# Patient Record
Sex: Female | Born: 1990 | Race: White | Hispanic: No | Marital: Single | Smoking: Never smoker
Health system: Southern US, Community
[De-identification: ages and names within clinical notes are randomized; demographics above are authoritative.]

## PROBLEM LIST (undated history)

## (undated) DIAGNOSIS — N72 Inflammatory disease of cervix uteri: Secondary | ICD-10-CM

## (undated) DIAGNOSIS — F329 Major depressive disorder, single episode, unspecified: Secondary | ICD-10-CM

## (undated) DIAGNOSIS — E0500 Thyrotoxicosis with diffuse goiter without thyrotoxic crisis or storm: Secondary | ICD-10-CM

## (undated) DIAGNOSIS — E039 Hypothyroidism, unspecified: Secondary | ICD-10-CM

## (undated) DIAGNOSIS — I10 Essential (primary) hypertension: Secondary | ICD-10-CM

## (undated) DIAGNOSIS — A6920 Lyme disease, unspecified: Secondary | ICD-10-CM

## (undated) DIAGNOSIS — F419 Anxiety disorder, unspecified: Secondary | ICD-10-CM

## (undated) DIAGNOSIS — F32A Depression, unspecified: Secondary | ICD-10-CM

## (undated) HISTORY — DX: Inflammatory disease of cervix uteri: N72

## (undated) HISTORY — PX: CYST EXCISION: SHX5701

## (undated) HISTORY — DX: Thyrotoxicosis with diffuse goiter without thyrotoxic crisis or storm: E05.00

## (undated) HISTORY — DX: Lyme disease, unspecified: A69.20

## (undated) HISTORY — PX: WISDOM TOOTH EXTRACTION: SHX21

## (undated) HISTORY — DX: Major depressive disorder, single episode, unspecified: F32.9

## (undated) HISTORY — DX: Hypothyroidism, unspecified: E03.9

## (undated) HISTORY — DX: Anxiety disorder, unspecified: F41.9

## (undated) HISTORY — DX: Depression, unspecified: F32.A

## (undated) HISTORY — DX: Essential (primary) hypertension: I10

---
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
## 2010-05-08 LAB — HCG, SERUM, QUALITATIVE: Preg, Serum: NEGATIVE

## 2012-10-22 ENCOUNTER — Other Ambulatory Visit: Payer: Self-pay | Admitting: Certified Nurse Midwife

## 2012-10-22 NOTE — Telephone Encounter (Signed)
eScribe request for refill on G. SeasonaleLast filled - *07/2012 Last AEX - 07-10-11 Next AEX - not scheduled

## 2012-10-23 ENCOUNTER — Telehealth: Payer: Self-pay | Admitting: Certified Nurse Midwife

## 2012-10-23 MED ORDER — LEVONORGEST-ETH ESTRAD 91-DAY 0.15-0.03 MG PO TABS: 1.0000 | ORAL_TABLET | Freq: Every day | ORAL | Status: DC

## 2012-10-23 NOTE — Telephone Encounter (Signed)
Pt had follow up for OCP in 01/2012.  Pt due for AEX in June 2014.  Per Kennon Rounds, OK to do one more refill.  Pt needs AEX prior to that refill running out. i spoke with patient.  appt for AEX on 11/18/12.  Advised she has to keep this appt for more refills.  She voices understanding.

## 2012-10-23 NOTE — Telephone Encounter (Signed)
Patient calling to check on refill for seasonal, said pharmacy was supposed to call, didn't know if she needed an appt.

## 2012-11-13 ENCOUNTER — Encounter: Payer: Self-pay | Admitting: Certified Nurse Midwife

## 2012-11-18 ENCOUNTER — Ambulatory Visit (INDEPENDENT_AMBULATORY_CARE_PROVIDER_SITE_OTHER): Payer: Managed Care, Other (non HMO) | Admitting: Certified Nurse Midwife

## 2012-11-18 ENCOUNTER — Encounter: Payer: Self-pay | Admitting: Certified Nurse Midwife

## 2012-11-18 VITALS — BP 102/70 | HR 68 | Resp 16 | Ht 63.25 in | Wt 133.0 lb

## 2012-11-18 DIAGNOSIS — Z01419 Encounter for gynecological examination (general) (routine) without abnormal findings: Secondary | ICD-10-CM

## 2012-11-18 DIAGNOSIS — Z0000 Encounter for general adult medical examination without abnormal findings: Secondary | ICD-10-CM

## 2012-11-18 NOTE — Progress Notes (Signed)
22 y.o. G0P0000 Single Caucasian Fe here for annual exam.  Periods normal every 3 months with Seasonale. Working great! Graduated from college and working at first job. No partner change, no STD screen needed. No flare of Graves disease, due for follow up. No health issues today.  Patient's last menstrual period was 11/08/2012.          Sexually active: yes  The current method of family planning is OCP (estrogen/progesterone) and condoms all the time.    Exercising: yes  walk,run & yoga Smoker:  no  Health Maintenance: Pap:  none MMG:  none Colonoscopy:  none BMD:   none TDaP:  2010 Labs: none Self breast exam: done occ   reports that she has never smoked. She does not have any smokeless tobacco history on file. She reports that she drinks about 1.0 ounces of alcohol per week. She reports that she does not use illicit drugs.  Past Medical History  Diagnosis Date  . Graves disease     goiter  . Depression   . Lyme disease 2008    Past Surgical History  Procedure Laterality Date  . Wisdom tooth extraction      Current Outpatient Prescriptions  Medication Sig Dispense Refill  . IBUPROFEN PO Take by mouth as needed.      Marland Kitchen levonorgestrel-ethinyl estradiol (SEASONALE,INTROVALE,JOLESSA) 0.15-0.03 MG tablet Take 1 tablet by mouth daily.  1 Package  4   No current facility-administered medications for this visit.    Family History  Problem Relation Age of Onset  . Thyroid disease Father   . ALS Father   . Depression Sister     ROS:  Pertinent items are noted in HPI.  Otherwise, a comprehensive ROS was negative.  Exam:   BP 102/70  Pulse 68  Resp 16  Ht 5' 3.25" (1.607 m)  Wt 133 lb (60.328 kg)  BMI 23.36 kg/m2  LMP 11/08/2012 Height: 5' 3.25" (160.7 cm)  Ht Readings from Last 3 Encounters:  11/18/12 5' 3.25" (1.607 m)    General appearance: alert, cooperative and appears stated age Head: Normocephalic, without obvious abnormality, atraumatic Neck: no adenopathy,  supple, symmetrical, trachea midline and thyroid normal to inspection and palpation Lungs: clear to auscultation bilaterally Breasts: normal appearance, no masses or tenderness, No nipple retraction or dimpling, No nipple discharge or bleeding, No axillary or supraclavicular adenopathy Heart: regular rate and rhythm Abdomen: soft, non-tender; no masses,  no organomegaly Extremities: extremities normal, atraumatic, no cyanosis or edema Skin: Skin color, texture, turgor normal. No rashes or lesions Lymph nodes: Cervical, supraclavicular, and axillary nodes normal. No abnormal inguinal nodes palpated Neurologic: Grossly normal   Pelvic: External genitalia:  no lesions              Urethra:  normal appearing urethra with no masses, tenderness or lesions              Bartholin's and Skene's: normal                 Vagina: normal appearing vagina with normal color and discharge, no lesions              Cervix: normal, non tender              Pap taken: yes Bimanual Exam:  Uterus:  normal size, contour, position, consistency, mobility, non-tender and anteverted              Adnexa: normal adnexa and no mass, fullness, tenderness  Rectovaginal: Confirms               Anus: deferred  A:  Well Woman with normal exam  Contraception OCP  Graves disease resolved  P:   Reviewed health and wellness pertinent to exam  Rx Seasonale see order  Continue follow up as indicated  Pap smear as per guidelines   pap smear taken today  counseled on breast self exam, STD prevention, HIV risk factors and prevention, use and side effects of OCP's, adequate intake of calcium and vitamin D, diet and exercise  return annually or prn  An After Visit Summary was printed and given to the patient.

## 2012-11-18 NOTE — Patient Instructions (Signed)
General topics  Next pap or exam is  due in 1 year Take a Women's multivitamin Take 1200 mg. of calcium daily - prefer dietary If any concerns in interim to call back  Breast Self-Awareness Practicing breast self-awareness may pick up problems early, prevent significant medical complications, and possibly save your life. By practicing breast self-awareness, you can become familiar with how your breasts look and feel and if your breasts are changing. This allows you to notice changes early. It can also offer you some reassurance that your breast health is good. One way to learn what is normal for your breasts and whether your breasts are changing is to do a breast self-exam. If you find a lump or something that was not present in the past, it is best to contact your caregiver right away. Other findings that should be evaluated by your caregiver include nipple discharge, especially if it is bloody; skin changes or reddening; areas where the skin seems to be pulled in (retracted); or new lumps and bumps. Breast pain is seldom associated with cancer (malignancy), but should also be evaluated by a caregiver. BREAST SELF-EXAM The best time to examine your breasts is 5 7 days after your menstrual period is over.  ExitCare Patient Information 2013 [employer].   Exercise to Stay Healthy Exercise helps you become and stay healthy. EXERCISE IDEAS AND TIPS Choose exercises that:  You enjoy.  Fit into your day. You do not need to exercise really hard to be healthy. You can do exercises at a slow or medium level and stay healthy. You can:  Stretch before and after working out.  Try yoga, Pilates, or tai chi.  Lift weights.  Walk fast, swim, jog, run, climb stairs, bicycle, dance, or rollerskate.  Take aerobic classes. Exercises that burn about 150 calories:  Running 1  miles in 15 minutes.  Playing volleyball for 45 to 60 minutes.  Washing and waxing a car for 45 to 60  minutes.  Playing touch football for 45 minutes.  Walking 1  miles in 35 minutes.  Pushing a stroller 1  miles in 30 minutes.  Playing basketball for 30 minutes.  Raking leaves for 30 minutes.  Bicycling 5 miles in 30 minutes.  Walking 2 miles in 30 minutes.  Dancing for 30 minutes.  Shoveling snow for 15 minutes.  Swimming laps for 20 minutes.  Walking up stairs for 15 minutes.  Bicycling 4 miles in 15 minutes.  Gardening for 30 to 45 minutes.  Jumping rope for 15 minutes.  Washing windows or floors for 45 to 60 minutes. Document Released: 03/10/2010 Document Revised: 04/30/2011 Document Reviewed: 03/10/2010 ExitCare Patient Information 2013 [employer].   Other topics ( that may be useful information):    Sexually Transmitted Disease Sexually transmitted disease (STD) refers to any infection that is passed from person to person during sexual activity. This may happen by way of saliva, semen, blood, vaginal mucus, or urine. Common STDs include:  Gonorrhea.  Chlamydia.  Syphilis.  HIV/AIDS.  Genital herpes.  Hepatitis B and C.  Trichomonas.  Human papillomavirus (HPV).  Pubic lice. CAUSES  An STD may be spread by bacteria, virus, or parasite. A person can get an STD by:  Sexual intercourse with an infected person.  Sharing sex toys with an infected person.  Sharing needles with an infected person.  Having intimate contact with the genitals, mouth, or rectal areas of an infected person. SYMPTOMS  Some people may not have any symptoms, but   they can still pass the infection to others. Different STDs have different symptoms. Symptoms include:  Painful or bloody urination.  Pain in the pelvis, abdomen, vagina, anus, throat, or eyes.  Skin rash, itching, irritation, growths, or sores (lesions). These usually occur in the genital or anal area.  Abnormal vaginal discharge.  Penile discharge in men.  Soft, flesh-colored skin growths in the  genital or anal area.  Fever.  Pain or bleeding during sexual intercourse.  Swollen glands in the groin area.  Yellow skin and eyes (jaundice). This is seen with hepatitis. DIAGNOSIS  To make a diagnosis, your caregiver may:  Take a medical history.  Perform a physical exam.  Take a specimen (culture) to be examined.  Examine a sample of discharge under a microscope.  Perform blood test TREATMENT   Chlamydia, gonorrhea, trichomonas, and syphilis can be cured with antibiotic medicine.  Genital herpes, hepatitis, and HIV can be treated, but not cured, with prescribed medicines. The medicines will lessen the symptoms.  Genital warts from HPV can be treated with medicine or by freezing, burning (electrocautery), or surgery. Warts may come back.  HPV is a virus and cannot be cured with medicine or surgery.However, abnormal areas may be followed very closely by your caregiver and may be removed from the cervix, vagina, or vulva through office procedures or surgery. If your diagnosis is confirmed, your recent sexual partners need treatment. This is true even if they are symptom-free or have a negative culture or evaluation. They should not have sex until their caregiver says it is okay. HOME CARE INSTRUCTIONS  All sexual partners should be informed, tested, and treated for all STDs.  Take your antibiotics as directed. Finish them even if you start to feel better.  Only take over-the-counter or prescription medicines for pain, discomfort, or fever as directed by your caregiver.  Rest.  Eat a balanced diet and drink enough fluids to keep your urine clear or pale yellow.  Do not have sex until treatment is completed and you have followed up with your caregiver. STDs should be checked after treatment.  Keep all follow-up appointments, Pap tests, and blood tests as directed by your caregiver.  Only use latex condoms and water-soluble lubricants during sexual activity. Do not use  petroleum jelly or oils.  Avoid alcohol and illegal drugs.  Get vaccinated for HPV and hepatitis. If you have not received these vaccines in the past, talk to your caregiver about whether one or both might be right for you.  Avoid risky sex practices that can break the skin. The only way to avoid getting an STD is to avoid all sexual activity.Latex condoms and dental dams (for oral sex) will help lessen the risk of getting an STD, but will not completely eliminate the risk. SEEK MEDICAL CARE IF:   You have a fever.  You have any new or worsening symptoms. Document Released: 04/28/2002 Document Revised: 04/30/2011 Document Reviewed: 05/05/2010 ExitCare Patient Information 2013 [employer].    Domestic Abuse You are being battered or abused if someone close to you hits, pushes, or physically hurts you in any way. You also are being abused if you are forced into activities. You are being sexually abused if you are forced to have sexual contact of any kind. You are being emotionally abused if you are made to feel worthless or if you are constantly threatened. It is important to remember that help is available. No one has the right to abuse you. PREVENTION OF FURTHER   ABUSE  Learn the warning signs of danger. This varies with situations but may include: the use of alcohol, threats, isolation from friends and family, or forced sexual contact. Leave if you feel that violence is going to occur.  If you are attacked or beaten, report it to the police so the abuse is documented. You do not have to press charges. The police can protect you while you or the attackers are leaving. Get the officer's name and badge number and a copy of the report.  Find someone you can trust and tell them what is happening to you: your caregiver, a nurse, clergy member, close friend or family member. Feeling ashamed is natural, but remember that you have done nothing wrong. No one deserves abuse. Document Released:  02/03/2000 Document Revised: 04/30/2011 Document Reviewed: 04/13/2010 ExitCare Patient Information 2013 [employer].    How Much is Too Much Alcohol? Drinking too much alcohol can cause injury, accidents, and health problems. These types of problems can include:   Car crashes.  Falls.  Family fighting (domestic violence).  Drowning.  Fights.  Injuries.  Burns.  Damage to certain organs.  Having a baby with birth defects. ONE DRINK CAN BE TOO MUCH WHEN YOU ARE:  Working.  Pregnant or breastfeeding.  Taking medicines. Ask your doctor.  Driving or planning to drive. If you or someone you know has a drinking problem, get help from a doctor.  Document Released: 12/02/2008 Document Revised: 04/30/2011 Document Reviewed: 12/02/2008 ExitCare Patient Information 2013 [employer].   Smoking Hazards Smoking cigarettes is extremely bad for your health. Tobacco smoke has over 200 known poisons in it. There are over 60 chemicals in tobacco smoke that cause cancer. Some of the chemicals found in cigarette smoke include:   Cyanide.  Benzene.  Formaldehyde.  Methanol (wood alcohol).  Acetylene (fuel used in welding torches).  Ammonia. Cigarette smoke also contains the poisonous gases nitrogen oxide and carbon monoxide.  Cigarette smokers have an increased risk of many serious medical problems and Smoking causes approximately:  90% of all lung cancer deaths in men.  80% of all lung cancer deaths in women.  90% of deaths from chronic obstructive lung disease. Compared with nonsmokers, smoking increases the risk of:  Coronary heart disease by 2 to 4 times.  Stroke by 2 to 4 times.  Men developing lung cancer by 23 times.  Women developing lung cancer by 13 times.  Dying from chronic obstructive lung diseases by 12 times.  . Smoking is the most preventable cause of death and disease in our society.  WHY IS SMOKING ADDICTIVE?  Nicotine is the chemical  agent in tobacco that is capable of causing addiction or dependence.  When you smoke and inhale, nicotine is absorbed rapidly into the bloodstream through your lungs. Nicotine absorbed through the lungs is capable of creating a powerful addiction. Both inhaled and non-inhaled nicotine may be addictive.  Addiction studies of cigarettes and spit tobacco show that addiction to nicotine occurs mainly during the teen years, when young people begin using tobacco products. WHAT ARE THE BENEFITS OF QUITTING?  There are many health benefits to quitting smoking.   Likelihood of developing cancer and heart disease decreases. Health improvements are seen almost immediately.  Blood pressure, pulse rate, and breathing patterns start returning to normal soon after quitting. QUITTING SMOKING   American Lung Association - 1-800-LUNGUSA  American Cancer Society - 1-800-ACS-2345 Document Released: 03/15/2004 Document Revised: 04/30/2011 Document Reviewed: 11/17/2008 ExitCare Patient Information 2013 [employer].   Stress Management Stress is a state of physical or mental tension that often results from changes in your life or normal routine. Some common causes of stress are:  Death of a loved one.  Injuries or severe illnesses.  Getting fired or changing jobs.  Moving into a new home. Other causes may be:  Sexual problems.  Business or financial losses.  Taking on a large debt.  Regular conflict with someone at home or at work.  Constant tiredness from lack of sleep. It is not just bad things that are stressful. It may be stressful to:  Win the lottery.  Get married.  Buy a new car. The amount of stress that can be easily tolerated varies from person to person. Changes generally cause stress, regardless of the types of change. Too much stress can affect your health. It may lead to physical or emotional problems. Too little stress (boredom) may also become stressful. SUGGESTIONS TO  REDUCE STRESS:  Talk things over with your family and friends. It often is helpful to share your concerns and worries. If you feel your problem is serious, you may want to get help from a professional counselor.  Consider your problems one at a time instead of lumping them all together. Trying to take care of everything at once may seem impossible. List all the things you need to do and then start with the most important one. Set a goal to accomplish 2 or 3 things each day. If you expect to do too many in a single day you will naturally fail, causing you to feel even more stressed.  Do not use alcohol or drugs to relieve stress. Although you may feel better for a short time, they do not remove the problems that caused the stress. They can also be habit forming.  Exercise regularly - at least 3 times per week. Physical exercise can help to relieve that "uptight" feeling and will relax you.  The shortest distance between despair and hope is often a good night's sleep.  Go to bed and get up on time allowing yourself time for appointments without being rushed.  Take a short "time-out" period from any stressful situation that occurs during the day. Close your eyes and take some deep breaths. Starting with the muscles in your face, tense them, hold it for a few seconds, then relax. Repeat this with the muscles in your neck, shoulders, hand, stomach, back and legs.  Take good care of yourself. Eat a balanced diet and get plenty of rest.  Schedule time for having fun. Take a break from your daily routine to relax. HOME CARE INSTRUCTIONS   Call if you feel overwhelmed by your problems and feel you can no longer manage them on your own.  Return immediately if you feel like hurting yourself or someone else. Document Released: 08/01/2000 Document Revised: 04/30/2011 Document Reviewed: 03/24/2007 ExitCare Patient Information 2013 [employer].   

## 2012-11-20 LAB — IPS PAP TEST WITH REFLEX TO HPV

## 2012-11-20 NOTE — Progress Notes (Signed)
Note reviewed, agree with plan.  Camerin Jimenez, MD  

## 2013-02-19 DIAGNOSIS — E039 Hypothyroidism, unspecified: Secondary | ICD-10-CM

## 2013-02-19 HISTORY — DX: Hypothyroidism, unspecified: E03.9

## 2013-11-20 ENCOUNTER — Encounter: Payer: Self-pay | Admitting: Gynecology

## 2013-11-20 ENCOUNTER — Ambulatory Visit (INDEPENDENT_AMBULATORY_CARE_PROVIDER_SITE_OTHER): Payer: Managed Care, Other (non HMO) | Admitting: Gynecology

## 2013-11-20 VITALS — BP 130/80 | HR 72 | Resp 14 | Ht 64.0 in | Wt 128.0 lb

## 2013-11-20 DIAGNOSIS — Z3041 Encounter for surveillance of contraceptive pills: Secondary | ICD-10-CM

## 2013-11-20 DIAGNOSIS — Z124 Encounter for screening for malignant neoplasm of cervix: Secondary | ICD-10-CM

## 2013-11-20 DIAGNOSIS — Z01419 Encounter for gynecological examination (general) (routine) without abnormal findings: Secondary | ICD-10-CM

## 2013-11-20 MED ORDER — LEVONORGEST-ETH ESTRAD 91-DAY 0.15-0.03 MG PO TABS: 1.0000 | ORAL_TABLET | Freq: Every day | ORAL | Status: DC

## 2013-11-20 NOTE — Progress Notes (Signed)
23 y.o. Single Caucasian female   G0P0000 here for annual exam. Pt is  currently sexually active.  She reports  using condoms on a regular basis.  First sexual activity at 23 years old, 7  number of lifetime partners.  New partner-60m, no dyspareunia, condoms every time.  t declines std screening. Pt diagnosed with hypothyroid last week, pt was noticed to have elevated BP and "anxiety" last year, started on both zoloft and inderal but is being evaluated for Pheochromocytoma. Reports sx are physical and not emotional is trying to wean off zoloft.   Patient's last menstrual period was 10/16/2013.          Sexually active: Yes.    The current method of family planning is OCP (estrogen/progesterone).    Exercising: Yes.    walking, yoga qd Last pap: 11/18/12 Neg Alcohol: 2 drinks/wk Tobacco:  no Drugs: no Gardisil: yes, completed: 8 years ago   Labs: Endocrinologist  Health Maintenance  Topic Date Due  . Tetanus/tdap  08/06/2009  . Influenza Vaccine  09/19/2013  . Pap Smear  11/19/2015    Family History  Problem Relation Age of Onset  . Thyroid disease Father   . ALS Father   . Depression Sister     There are no active problems to display for this patient.   Past Medical History  Diagnosis Date  . Graves disease     goiter  . Depression   . Lyme disease 2008  . Hypothyroidism     Past Surgical History  Procedure Laterality Date  . Wisdom tooth extraction      Allergies: Review of patient's allergies indicates no known allergies.  Current Outpatient Prescriptions  Medication Sig Dispense Refill  . IBUPROFEN PO Take by mouth as needed.      Marland Kitchen levonorgestrel-ethinyl estradiol (SEASONALE,INTROVALE,JOLESSA) 0.15-0.03 MG tablet Take 1 tablet by mouth daily.  1 Package  4  . levothyroxine (SYNTHROID, LEVOTHROID) 50 MCG tablet Take 50 mcg by mouth daily before breakfast.       No current facility-administered medications for this visit.    ROS: Pertinent items are noted in  HPI.  Exam:    Ht 5\' 4"  (1.626 m)  Wt 128 lb (58.06 kg)  BMI 21.96 kg/m2  LMP 10/16/2013 Weight change: @WEIGHTCHANGE @ Last 3 height recordings:  Ht Readings from Last 3 Encounters:  11/20/13 5\' 4"  (1.626 m)  11/18/12 5' 3.25" (1.607 m)   General appearance: alert, cooperative and appears stated age Head: Normocephalic, without obvious abnormality, atraumatic Neck: no adenopathy, no carotid bruit, no JVD, supple, symmetrical, trachea midline and thyroid not enlarged, symmetric, no tenderness/mass/nodules Lungs: clear to auscultation bilaterally Breasts: Inspection negative, No nipple retraction or dimpling, No nipple discharge or bleeding, No axillary or supraclavicular adenopathy, Normal to palpation without dominant masses Heart: regular rate and rhythm, S1, S2 normal, no murmur, click, rub or gallop Abdomen: soft, non-tender; bowel sounds normal; no masses,  no organomegaly Extremities: extremities normal, atraumatic, no cyanosis or edema Skin: Skin color, texture, turgor normal. No rashes or lesions Lymph nodes: Cervical, supraclavicular, and axillary nodes normal. no inguinal nodes palpated Neurologic: Grossly normal   Pelvic: External genitalia:  normal escutcheon              Urethra: normal appearing urethra with no masses, tenderness or lesions              Bartholins and Skenes: Bartholin's, Urethra, Skene's normal  Vagina: normal appearing vagina with normal color and discharge, no lesions              Cervix: normal appearance              Pap taken: Yes.          Bimanual Exam:  Uterus:  uterus is normal size, shape, consistency and nontender                                      Adnexa:    normal adnexa in size, nontender and no masses                                      Rectovaginal: Confirms                                      Anus:  normal sphincter tone, no lesions       1. Encounter for surveillance of contraceptive pills  -  levonorgestrel-ethinyl estradiol (SEASONALE,INTROVALE,JOLESSA) 0.15-0.03 MG tablet; Take 1 tablet by mouth daily.  Dispense: 1 Package; Refill: 4  2. Encounter for routine gynecological examination  counseled on breast self exam, use and side effects of OCP's, adequate intake of calcium and vitamin D, diet and exercise return annually or prn Discussed STD prevention, regular condom use.   3. Screening for cervical cancer PAP guidelines reviewed - PAP with Reflex to HPV (IPS)  An After Visit Summary was printed and given to the patient.

## 2013-11-23 LAB — IPS PAP TEST WITH REFLEX TO HPV

## 2014-01-13 ENCOUNTER — Other Ambulatory Visit: Payer: Self-pay | Admitting: Certified Nurse Midwife

## 2014-11-24 ENCOUNTER — Encounter: Payer: Self-pay | Admitting: Certified Nurse Midwife

## 2014-11-24 ENCOUNTER — Ambulatory Visit (INDEPENDENT_AMBULATORY_CARE_PROVIDER_SITE_OTHER): Payer: Managed Care, Other (non HMO) | Admitting: Certified Nurse Midwife

## 2014-11-24 VITALS — BP 112/72 | HR 74 | Resp 20 | Ht 63.75 in | Wt 135.0 lb

## 2014-11-24 DIAGNOSIS — Z01419 Encounter for gynecological examination (general) (routine) without abnormal findings: Secondary | ICD-10-CM | POA: Diagnosis not present

## 2014-11-24 DIAGNOSIS — Z0000 Encounter for general adult medical examination without abnormal findings: Secondary | ICD-10-CM

## 2014-11-24 DIAGNOSIS — Z3041 Encounter for surveillance of contraceptive pills: Secondary | ICD-10-CM

## 2014-11-24 LAB — HEMOGLOBIN, FINGERSTICK: Hemoglobin, fingerstick: 13.3 g/dL (ref 12.0–16.0)

## 2014-11-24 MED ORDER — LEVONORGEST-ETH ESTRAD 91-DAY 0.15-0.03 MG PO TABS: 1.0000 | ORAL_TABLET | Freq: Every day | ORAL | Status: DC

## 2014-11-24 NOTE — Patient Instructions (Signed)
General topics  Next pap or exam is  due in 1 year Take a Women's multivitamin Take 1200 mg. of calcium daily - prefer dietary If any concerns in interim to call back  Breast Self-Awareness Practicing breast self-awareness may pick up problems early, prevent significant medical complications, and possibly save your life. By practicing breast self-awareness, you can become familiar with how your breasts look and feel and if your breasts are changing. This allows you to notice changes early. It can also offer you some reassurance that your breast health is good. One way to learn what is normal for your breasts and whether your breasts are changing is to do a breast self-exam. If you find a lump or something that was not present in the past, it is best to contact your caregiver right away. Other findings that should be evaluated by your caregiver include nipple discharge, especially if it is bloody; skin changes or reddening; areas where the skin seems to be pulled in (retracted); or new lumps and bumps. Breast pain is seldom associated with cancer (malignancy), but should also be evaluated by a caregiver. BREAST SELF-EXAM The best time to examine your breasts is 5 7 days after your menstrual period is over.  ExitCare Patient Information 2013 [employer].   Exercise to Stay Healthy Exercise helps you become and stay healthy. EXERCISE IDEAS AND TIPS Choose exercises that:  You enjoy.  Fit into your day. You do not need to exercise really hard to be healthy. You can do exercises at a slow or medium level and stay healthy. You can:  Stretch before and after working out.  Try yoga, Pilates, or tai chi.  Lift weights.  Walk fast, swim, jog, run, climb stairs, bicycle, dance, or rollerskate.  Take aerobic classes. Exercises that burn about 150 calories:  Running 1  miles in 15 minutes.  Playing volleyball for 45 to 60 minutes.  Washing and waxing a car for 45 to 60  minutes.  Playing touch football for 45 minutes.  Walking 1  miles in 35 minutes.  Pushing a stroller 1  miles in 30 minutes.  Playing basketball for 30 minutes.  Raking leaves for 30 minutes.  Bicycling 5 miles in 30 minutes.  Walking 2 miles in 30 minutes.  Dancing for 30 minutes.  Shoveling snow for 15 minutes.  Swimming laps for 20 minutes.  Walking up stairs for 15 minutes.  Bicycling 4 miles in 15 minutes.  Gardening for 30 to 45 minutes.  Jumping rope for 15 minutes.  Washing windows or floors for 45 to 60 minutes. Document Released: 03/10/2010 Document Revised: 04/30/2011 Document Reviewed: 03/10/2010 ExitCare Patient Information 2013 [employer].   Other topics ( that may be useful information):    Sexually Transmitted Disease Sexually transmitted disease (STD) refers to any infection that is passed from person to person during sexual activity. This may happen by way of saliva, semen, blood, vaginal mucus, or urine. Common STDs include:  Gonorrhea.  Chlamydia.  Syphilis.  HIV/AIDS.  Genital herpes.  Hepatitis B and C.  Trichomonas.  Human papillomavirus (HPV).  Pubic lice. CAUSES  An STD may be spread by bacteria, virus, or parasite. A person can get an STD by:  Sexual intercourse with an infected person.  Sharing sex toys with an infected person.  Sharing needles with an infected person.  Having intimate contact with the genitals, mouth, or rectal areas of an infected person. SYMPTOMS  Some people may not have any symptoms, but   they can still pass the infection to others. Different STDs have different symptoms. Symptoms include:  Painful or bloody urination.  Pain in the pelvis, abdomen, vagina, anus, throat, or eyes.  Skin rash, itching, irritation, growths, or sores (lesions). These usually occur in the genital or anal area.  Abnormal vaginal discharge.  Penile discharge in men.  Soft, flesh-colored skin growths in the  genital or anal area.  Fever.  Pain or bleeding during sexual intercourse.  Swollen glands in the groin area.  Yellow skin and eyes (jaundice). This is seen with hepatitis. DIAGNOSIS  To make a diagnosis, your caregiver may:  Take a medical history.  Perform a physical exam.  Take a specimen (culture) to be examined.  Examine a sample of discharge under a microscope.  Perform blood test TREATMENT   Chlamydia, gonorrhea, trichomonas, and syphilis can be cured with antibiotic medicine.  Genital herpes, hepatitis, and HIV can be treated, but not cured, with prescribed medicines. The medicines will lessen the symptoms.  Genital warts from HPV can be treated with medicine or by freezing, burning (electrocautery), or surgery. Warts may come back.  HPV is a virus and cannot be cured with medicine or surgery.However, abnormal areas may be followed very closely by your caregiver and may be removed from the cervix, vagina, or vulva through office procedures or surgery. If your diagnosis is confirmed, your recent sexual partners need treatment. This is true even if they are symptom-free or have a negative culture or evaluation. They should not have sex until their caregiver says it is okay. HOME CARE INSTRUCTIONS  All sexual partners should be informed, tested, and treated for all STDs.  Take your antibiotics as directed. Finish them even if you start to feel better.  Only take over-the-counter or prescription medicines for pain, discomfort, or fever as directed by your caregiver.  Rest.  Eat a balanced diet and drink enough fluids to keep your urine clear or pale yellow.  Do not have sex until treatment is completed and you have followed up with your caregiver. STDs should be checked after treatment.  Keep all follow-up appointments, Pap tests, and blood tests as directed by your caregiver.  Only use latex condoms and water-soluble lubricants during sexual activity. Do not use  petroleum jelly or oils.  Avoid alcohol and illegal drugs.  Get vaccinated for HPV and hepatitis. If you have not received these vaccines in the past, talk to your caregiver about whether one or both might be right for you.  Avoid risky sex practices that can break the skin. The only way to avoid getting an STD is to avoid all sexual activity.Latex condoms and dental dams (for oral sex) will help lessen the risk of getting an STD, but will not completely eliminate the risk. SEEK MEDICAL CARE IF:   You have a fever.  You have any new or worsening symptoms. Document Released: 04/28/2002 Document Revised: 04/30/2011 Document Reviewed: 05/05/2010 [facility] Patient Information 2013 [place].    Domestic Abuse You are being battered or abused if someone close to you hits, pushes, or physically hurts you in any way. You also are being abused if you are forced into activities. You are being sexually abused if you are forced to have sexual contact of any kind. You are being emotionally abused if you are made to feel worthless or if you are constantly threatened. It is important to remember that help is available. No one has the right to abuse you. PREVENTION OF FURTHER  ABUSE  Learn the warning signs of danger. This varies with situations but may include: the use of alcohol, threats, isolation from friends and family, or forced sexual contact. Leave if you feel that violence is going to occur.  If you are attacked or beaten, report it to the police so the abuse is documented. You do not have to press charges. The police can protect you while you or the attackers are leaving. Get the officer's name and badge number and a copy of the report.  Find someone you can trust and tell them what is happening to you: your caregiver, a nurse, clergy member, close friend or family member. Feeling ashamed is natural, but remember that you have done nothing wrong. No one deserves abuse. Document Released:  02/03/2000 Document Revised: 04/30/2011 Document Reviewed: 04/13/2010 ExitCare Patient Information 2013 [employer].    How Much is Too Much Alcohol? Drinking too much alcohol can cause injury, accidents, and health problems. These types of problems can include:   Car crashes.  Falls.  Family fighting (domestic violence).  Drowning.  Fights.  Injuries.  Burns.  Damage to certain organs.  Having a baby with birth defects. ONE DRINK CAN BE TOO MUCH WHEN YOU ARE:  Working.  Pregnant or breastfeeding.  Taking medicines. Ask your doctor.  Driving or planning to drive. If you or someone you know has a drinking problem, get help from a doctor.  Document Released: 12/02/2008 Document Revised: 04/30/2011 Document Reviewed: 12/02/2008 ExitCare Patient Information 2013 [employer].   Smoking Hazards Smoking cigarettes is extremely bad for your health. Tobacco smoke has over 200 known poisons in it. There are over 60 chemicals in tobacco smoke that cause cancer. Some of the chemicals found in cigarette smoke include:   Cyanide.  Benzene.  Formaldehyde.  Methanol (wood alcohol).  Acetylene (fuel used in welding torches).  Ammonia. Cigarette smoke also contains the poisonous gases nitrogen oxide and carbon monoxide.  Cigarette smokers have an increased risk of many serious medical problems and Smoking causes approximately:  90% of all lung cancer deaths in men.  80% of all lung cancer deaths in women.  90% of deaths from chronic obstructive lung disease. Compared with nonsmokers, smoking increases the risk of:  Coronary heart disease by 2 to 4 times.  Stroke by 2 to 4 times.  Men developing lung cancer by 23 times.  Women developing lung cancer by 13 times.  Dying from chronic obstructive lung diseases by 12 times.  . Smoking is the most preventable cause of death and disease in our society.  WHY IS SMOKING ADDICTIVE?  Nicotine is the chemical  agent in tobacco that is capable of causing addiction or dependence.  When you smoke and inhale, nicotine is absorbed rapidly into the bloodstream through your lungs. Nicotine absorbed through the lungs is capable of creating a powerful addiction. Both inhaled and non-inhaled nicotine may be addictive.  Addiction studies of cigarettes and spit tobacco show that addiction to nicotine occurs mainly during the teen years, when young people begin using tobacco products. WHAT ARE THE BENEFITS OF QUITTING?  There are many health benefits to quitting smoking.   Likelihood of developing cancer and heart disease decreases. Health improvements are seen almost immediately.  Blood pressure, pulse rate, and breathing patterns start returning to normal soon after quitting. QUITTING SMOKING   American Lung Association - 1-800-LUNGUSA  American Cancer Society - 1-800-ACS-2345 Document Released: 03/15/2004 Document Revised: 04/30/2011 Document Reviewed: 11/17/2008 ExitCare Patient Information 2013 ExitCare,   LLC.   Stress Management Stress is a state of physical or mental tension that often results from changes in your life or normal routine. Some common causes of stress are:  Death of a loved one.  Injuries or severe illnesses.  Getting fired or changing jobs.  Moving into a new home. Other causes may be:  Sexual problems.  Business or financial losses.  Taking on a large debt.  Regular conflict with someone at home or at work.  Constant tiredness from lack of sleep. It is not just bad things that are stressful. It may be stressful to:  Win the lottery.  Get married.  Buy a new car. The amount of stress that can be easily tolerated varies from person to person. Changes generally cause stress, regardless of the types of change. Too much stress can affect your health. It may lead to physical or emotional problems. Too little stress (boredom) may also become stressful. SUGGESTIONS TO  REDUCE STRESS:  Talk things over with your family and friends. It often is helpful to share your concerns and worries. If you feel your problem is serious, you may want to get help from a professional counselor.  Consider your problems one at a time instead of lumping them all together. Trying to take care of everything at once may seem impossible. List all the things you need to do and then start with the most important one. Set a goal to accomplish 2 or 3 things each day. If you expect to do too many in a single day you will naturally fail, causing you to feel even more stressed.  Do not use alcohol or drugs to relieve stress. Although you may feel better for a short time, they do not remove the problems that caused the stress. They can also be habit forming.  Exercise regularly - at least 3 times per week. Physical exercise can help to relieve that "uptight" feeling and will relax you.  The shortest distance between despair and hope is often a good night's sleep.  Go to bed and get up on time allowing yourself time for appointments without being rushed.  Take a short "time-out" period from any stressful situation that occurs during the day. Close your eyes and take some deep breaths. Starting with the muscles in your face, tense them, hold it for a few seconds, then relax. Repeat this with the muscles in your neck, shoulders, hand, stomach, back and legs.  Take good care of yourself. Eat a balanced diet and get plenty of rest.  Schedule time for having fun. Take a break from your daily routine to relax. HOME CARE INSTRUCTIONS   Call if you feel overwhelmed by your problems and feel you can no longer manage them on your own.  Return immediately if you feel like hurting yourself or someone else. Document Released: 08/01/2000 Document Revised: 04/30/2011 Document Reviewed: 03/24/2007 ExitCare Patient Information 2013 ExitCare[employer].   

## 2014-11-24 NOTE — Progress Notes (Signed)
24 y.o. G0P0000 Single  Caucasian Fe here for annual exam. Contraception working well with OCP. Periods normal with Seasonale. Partner change, had STD screening at health department. 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 She plans to take time out and re-energize! Sees PCP for aex/labs and thyroid management. No change in Synthroid dosage and labs were normal. No other health issues today.  Patient's last menstrual period was 10/20/2014.          Sexually active: Yes.    The current method of family planning is OCP (estrogen/progesterone).    Exercising: Yes.    walking,running & yoga Smoker:  no  Health Maintenance: Pap: 11-20-13 neg MMG:  none Colonoscopy:  none BMD:   none TDaP:  2010 Labs: hgb-13.3 Self breast exam: done occ   reports that she has never smoked. She does not have any smokeless tobacco history on file. She reports that she drinks about 1.0 oz of alcohol per week. She reports that she does not use illicit drugs.  Past Medical History  Diagnosis Date  . Graves disease     goiter  . Depression   . Lyme disease 2008  . Hypothyroidism 2015    Past Surgical History  Procedure Laterality Date  . Wisdom tooth extraction      Current Outpatient Prescriptions  Medication Sig Dispense Refill  . ACETAMINOPHEN PO Take by mouth as needed.    . clonazePAM (KLONOPIN) 0.5 MG tablet TAKE 1/2 TABLET TWICE DAILY AS NEEDED  0  . levonorgestrel-ethinyl estradiol (SEASONALE,INTROVALE,JOLESSA) 0.15-0.03 MG tablet Take 1 tablet by mouth daily. 1 Package 4  . levothyroxine (SYNTHROID, LEVOTHROID) 75 MCG tablet Take 75 mcg by mouth daily.  5  . omeprazole (PRILOSEC) 40 MG capsule Take 40 mg by mouth daily.  0  . propranolol (INDERAL) 10 MG tablet Take 10 mg by mouth 2 (two) times daily.  4   No current facility-administered medications for this visit.    Family History  Problem Relation Age of Onset  . Thyroid disease  Father   . ALS Father   . Depression Sister     ROS:  Pertinent items are noted in HPI.  Otherwise, a comprehensive ROS was negative.  Exam:   BP 112/72 mmHg  Pulse 74  Resp 20  Ht 5' 3.75" (1.619 m)  Wt 135 lb (61.236 kg)  BMI 23.36 kg/m2  LMP 10/20/2014 Height: 5' 3.75" (161.9 cm) Ht Readings from Last 3 Encounters:  11/24/14 5' 3.75" (1.619 m)  11/20/13 5\' 4"  (1.626 m)  11/18/12 5' 3.25" (1.607 m)    General appearance: alert, cooperative and appears stated age Head: Normocephalic, without obvious abnormality, atraumatic Neck: no adenopathy, supple, symmetrical, trachea midline and thyroid normal to inspection and palpation Lungs: clear to auscultation bilaterally Breasts: normal appearance, no masses or tenderness, No nipple retraction or dimpling, No nipple discharge or bleeding, No axillary or supraclavicular adenopathy Heart: regular rate and rhythm Abdomen: soft, non-tender; no masses,  no organomegaly Extremities: extremities normal, atraumatic, no cyanosis or edema Skin: Skin color, texture, turgor normal. No rashes or lesions Lymph nodes: Cervical, supraclavicular, and axillary nodes normal. No abnormal inguinal nodes palpated Neurologic: Grossly normal   Pelvic: External genitalia:  no lesions              Urethra:  normal appearing urethra with no masses, tenderness or lesions              Bartholin's and  Skene's: normal                 Vagina: normal appearing vagina with normal color and discharge, no lesions              Cervix: normal no tenderness or lesions              Pap taken: No. Bimanual Exam:  Uterus:  normal size, contour, position, consistency, mobility, non-tender and anteverted              Adnexa: normal adnexa and no mass, fullness, tenderness               Rectovaginal: Confirms               Anus:  normal sphincter tone, no lesions  Chaperone present: yes  A:  Well Woman with normal exam  Contraception OCP desired  Social stress with  job demands but support of family  Hypothyroid with PCP management, stable medciation  P:   Reviewed health and wellness pertinent to exam  Rx Seasonale see order  Discussed taking time for self now while seeking other employment. Encouraged to continue her family support as needed.  Pap smear as above not taken today   counseled on breast self exam, STD prevention, HIV risk factors and prevention, use and side effects of OCP's, adequate intake of calcium and vitamin D, diet and exercise  return annually or prn  An After Visit Summary was printed and given to the patient.

## 2014-11-24 NOTE — Progress Notes (Signed)
Reviewed personally.  M. Suzanne Eraina Winnie, MD.  

## 2015-01-25 ENCOUNTER — Telehealth: Payer: Self-pay

## 2015-01-25 NOTE — Telephone Encounter (Signed)
Rx denied. Rx written 11/24/2014 1 package. 3Refills.

## 2015-11-29 ENCOUNTER — Encounter: Payer: Self-pay | Admitting: Certified Nurse Midwife

## 2015-11-29 ENCOUNTER — Ambulatory Visit (INDEPENDENT_AMBULATORY_CARE_PROVIDER_SITE_OTHER): Payer: Managed Care, Other (non HMO) | Admitting: Certified Nurse Midwife

## 2015-11-29 VITALS — BP 108/70 | HR 70 | Resp 16 | Ht 63.25 in | Wt 132.0 lb

## 2015-11-29 DIAGNOSIS — Z0000 Encounter for general adult medical examination without abnormal findings: Secondary | ICD-10-CM | POA: Diagnosis not present

## 2015-11-29 DIAGNOSIS — S3141XA Laceration without foreign body of vagina and vulva, initial encounter: Secondary | ICD-10-CM | POA: Diagnosis not present

## 2015-11-29 DIAGNOSIS — Z3041 Encounter for surveillance of contraceptive pills: Secondary | ICD-10-CM | POA: Diagnosis not present

## 2015-11-29 DIAGNOSIS — Z01419 Encounter for gynecological examination (general) (routine) without abnormal findings: Secondary | ICD-10-CM

## 2015-11-29 MED ORDER — LEVONORGEST-ETH ESTRAD 91-DAY 0.15-0.03 MG PO TABS: 1.0000 | ORAL_TABLET | Freq: Every day | ORAL | 4 refills | Status: DC

## 2015-11-29 NOTE — Progress Notes (Signed)
25 y.o. G0P0000 Single  Caucasian Fe here for annual exam. Periods normals up until 8/17 when she had period for two days and restarted new pack. Has had some spotting with intercourse recently just light  Pink and some tenderness. Taking Doxycyline for face and cyst, so recently used Monistat for itching and relieved symptoms. Taking continuous OCP  Has been on same generic for about 6 months. Aware of bleeding profile with continuous use OCP . New partner desires STD screening. Sees Endocrine for Hypothyroid management, all stable. No other health issues today.  Patient's last menstrual period was 10/09/2015 (exact date).          Sexually active: Yes.    The current method of family planning is OCP (estrogen/progesterone). & condoms   Exercising: Yes.    yoga, running & walking Smoker:  no  Health Maintenance: Pap:  11-20-13 neg MMG:  none Colonoscopy:  none BMD:   none TDaP:  2010 Shingles: no Pneumonia: no Hep C and HIV: had done at health department 2016 neg Labs: none Self breast exam: done occ   reports that she has never smoked. She has never used smokeless tobacco. She reports that she drinks about 1.0 oz of alcohol per week . She reports that she does not use drugs.  Past Medical History:  Diagnosis Date  . Anxiety   . Depression   . Graves disease    goiter  . Hypothyroidism 2015  . Lyme disease 2008    Past Surgical History:  Procedure Laterality Date  . WISDOM TOOTH EXTRACTION      Current Outpatient Prescriptions  Medication Sig Dispense Refill  . ACETAMINOPHEN PO Take by mouth as needed.    . doxycycline (VIBRAMYCIN) 100 MG capsule Take 100 mg by mouth 2 (two) times daily.  3  . levonorgestrel-ethinyl estradiol (SEASONALE,INTROVALE,JOLESSA) 0.15-0.03 MG tablet Take 1 tablet by mouth daily. 1 Package 5  . levothyroxine (SYNTHROID, LEVOTHROID) 112 MCG tablet TAKE 1 TABLET ON AN EMPTY STOMACH IN THE MORNING ONCE A DAY ORALLY 30 DAYS  5  . propranolol (INDERAL)  10 MG tablet Take 10 mg by mouth 2 (two) times daily.  4  . ranitidine (ZANTAC) 150 MG tablet TAKE 1-2 TABLET AT BEDTIME ONCE A DAY ORALLY 90 DAY(S)  1   No current facility-administered medications for this visit.     Family History  Problem Relation Age of Onset  . Thyroid disease Father   . ALS Father   . Depression Sister     ROS:  Pertinent items are noted in HPI.  Otherwise, a comprehensive ROS was negative.  Exam:   BP 108/70   Pulse 70   Resp 16   Ht 5' 3.25" (1.607 m)   Wt 132 lb (59.9 kg)   LMP 10/09/2015 (Exact Date)   BMI 23.20 kg/m  Height: 5' 3.25" (160.7 cm) Ht Readings from Last 3 Encounters:  11/29/15 5' 3.25" (1.607 m)  11/24/14 5' 3.75" (1.619 m)  11/20/13 5\' 4"  (1.626 m)    General appearance: alert, cooperative and appears stated age Head: Normocephalic, without obvious abnormality, atraumatic Neck: no adenopathy, supple, symmetrical, trachea midline and thyroid normal to inspection and palpation Lungs: clear to auscultation bilaterally Breasts: normal appearance, no masses or tenderness, No nipple retraction or dimpling, No nipple discharge or bleeding, No axillary or supraclavicular adenopathy Heart: regular rate and rhythm Abdomen: soft, non-tender; no masses,  no organomegaly Extremities: extremities normal, atraumatic, no cyanosis or edema Skin: Skin color, texture, turgor  normal. No rashes or lesions Lymph nodes: Cervical, supraclavicular, and axillary nodes normal. No abnormal inguinal nodes palpated Neurologic: Grossly normal   Pelvic: External genitalia:  no lesions              Urethra:  normal appearing urethra with no masses, tenderness or lesions              Bartholin's and Skene's: normal                 Vagina: normal appearing vagina with normal color and discharge, no lesions, small superficial lacerations noted on right at 9 o'clock and under clitoral hood. No active bleeding, appears to be healing, but tender. Patient given  mirror to observe areas.              Cervix: no cervical motion tenderness, no lesions and nulliparous appearance              Pap taken: No. Bimanual Exam:  Uterus:  normal size, contour, position, consistency, mobility, non-tender              Adnexa: normal adnexa and no mass, fullness, tenderness               Rectovaginal: Confirms               Anus:  normal appearance  Chaperone present: yes  A:  Well Woman with normal exam  Contraception OCP desired  Superficial lacerations of vagina and under clitoral hood from sexual stimulation, healing  Hypothyroid with Endocrine management  Screening labs  P:   Reviewed health and wellness pertinent to exam  Discussed if continues with spotting unrelated to sexual activity can consider OCP change . Patient will keep menses record for spotting .  Rx Seasonale see order with instructions  Discussed findings after shown in mirror. Discussed less vigorous hand and oral stimulation by partner. Discussed coconut oil use for protection of areas now during urination and can also use for sexual activity. Questions addressed. Will advise if tenderness does not resolve. Can also do sitz bath for comfort with baking soda.  Continue MD follow up as indicated.  Labs: HIV,RPR, GC, Chlamydia, Affirm  Pap smear as above not taken   counseled on breast self exam, STD prevention, HIV risk factors and prevention, use and side effects of OCP's, adequate intake of calcium and vitamin D, diet and exercise  return annually or prn  An After Visit Summary was printed and given to the patient.

## 2015-11-29 NOTE — Patient Instructions (Signed)
General topics  Next pap or exam is  due in 1 year Take a Women's multivitamin Take 1200 mg. of calcium daily - prefer dietary If any concerns in interim to call back  Breast Self-Awareness Practicing breast self-awareness may pick up problems early, prevent significant medical complications, and possibly save your life. By practicing breast self-awareness, you can become familiar with how your breasts look and feel and if your breasts are changing. This allows you to notice changes early. It can also offer you some reassurance that your breast health is good. One way to learn what is normal for your breasts and whether your breasts are changing is to do a breast self-exam. If you find a lump or something that was not present in the past, it is best to contact your caregiver right away. Other findings that should be evaluated by your caregiver include nipple discharge, especially if it is bloody; skin changes or reddening; areas where the skin seems to be pulled in (retracted); or new lumps and bumps. Breast pain is seldom associated with cancer (malignancy), but should also be evaluated by a caregiver. BREAST SELF-EXAM The best time to examine your breasts is 5 7 days after your menstrual period is over.  ExitCare Patient Information 2013 [employer].   Exercise to Stay Healthy Exercise helps you become and stay healthy. EXERCISE IDEAS AND TIPS Choose exercises that:  You enjoy.  Fit into your day. You do not need to exercise really hard to be healthy. You can do exercises at a slow or medium level and stay healthy. You can:  Stretch before and after working out.  Try yoga, Pilates, or tai chi.  Lift weights.  Walk fast, swim, jog, run, climb stairs, bicycle, dance, or rollerskate.  Take aerobic classes. Exercises that burn about 150 calories:  Running 1  miles in 15 minutes.  Playing volleyball for 45 to 60 minutes.  Washing and waxing a car for 45 to 60  minutes.  Playing touch football for 45 minutes.  Walking 1  miles in 35 minutes.  Pushing a stroller 1  miles in 30 minutes.  Playing basketball for 30 minutes.  Raking leaves for 30 minutes.  Bicycling 5 miles in 30 minutes.  Walking 2 miles in 30 minutes.  Dancing for 30 minutes.  Shoveling snow for 15 minutes.  Swimming laps for 20 minutes.  Walking up stairs for 15 minutes.  Bicycling 4 miles in 15 minutes.  Gardening for 30 to 45 minutes.  Jumping rope for 15 minutes.  Washing windows or floors for 45 to 60 minutes. Document Released: 03/10/2010 Document Revised: 04/30/2011 Document Reviewed: 03/10/2010 ExitCare Patient Information 2013 [employer].   Other topics ( that may be useful information):    Sexually Transmitted Disease Sexually transmitted disease (STD) refers to any infection that is passed from person to person during sexual activity. This may happen by way of saliva, semen, blood, vaginal mucus, or urine. Common STDs include:  Gonorrhea.  Chlamydia.  Syphilis.  HIV/AIDS.  Genital herpes.  Hepatitis B and C.  Trichomonas.  Human papillomavirus (HPV).  Pubic lice. CAUSES  An STD may be spread by bacteria, virus, or parasite. A person can get an STD by:  Sexual intercourse with an infected person.  Sharing sex toys with an infected person.  Sharing needles with an infected person.  Having intimate contact with the genitals, mouth, or rectal areas of an infected person. SYMPTOMS  Some people may not have any symptoms, but   they can still pass the infection to others. Different STDs have different symptoms. Symptoms include:  Painful or bloody urination.  Pain in the pelvis, abdomen, vagina, anus, throat, or eyes.  Skin rash, itching, irritation, growths, or sores (lesions). These usually occur in the genital or anal area.  Abnormal vaginal discharge.  Penile discharge in men.  Soft, flesh-colored skin growths in the  genital or anal area.  Fever.  Pain or bleeding during sexual intercourse.  Swollen glands in the groin area.  Yellow skin and eyes (jaundice). This is seen with hepatitis. DIAGNOSIS  To make a diagnosis, your caregiver may:  Take a medical history.  Perform a physical exam.  Take a specimen (culture) to be examined.  Examine a sample of discharge under a microscope.  Perform blood test TREATMENT   Chlamydia, gonorrhea, trichomonas, and syphilis can be cured with antibiotic medicine.  Genital herpes, hepatitis, and HIV can be treated, but not cured, with prescribed medicines. The medicines will lessen the symptoms.  Genital warts from HPV can be treated with medicine or by freezing, burning (electrocautery), or surgery. Warts may come back.  HPV is a virus and cannot be cured with medicine or surgery.However, abnormal areas may be followed very closely by your caregiver and may be removed from the cervix, vagina, or vulva through office procedures or surgery. If your diagnosis is confirmed, your recent sexual partners need treatment. This is true even if they are symptom-free or have a negative culture or evaluation. They should not have sex until their caregiver says it is okay. HOME CARE INSTRUCTIONS  All sexual partners should be informed, tested, and treated for all STDs.  Take your antibiotics as directed. Finish them even if you start to feel better.  Only take over-the-counter or prescription medicines for pain, discomfort, or fever as directed by your caregiver.  Rest.  Eat a balanced diet and drink enough fluids to keep your urine clear or pale yellow.  Do not have sex until treatment is completed and you have followed up with your caregiver. STDs should be checked after treatment.  Keep all follow-up appointments, Pap tests, and blood tests as directed by your caregiver.  Only use latex condoms and water-soluble lubricants during sexual activity. Do not use  petroleum jelly or oils.  Avoid alcohol and illegal drugs.  Get vaccinated for HPV and hepatitis. If you have not received these vaccines in the past, talk to your caregiver about whether one or both might be right for you.  Avoid risky sex practices that can break the skin. The only way to avoid getting an STD is to avoid all sexual activity.Latex condoms and dental dams (for oral sex) will help lessen the risk of getting an STD, but will not completely eliminate the risk. SEEK MEDICAL CARE IF:   You have a fever.  You have any new or worsening symptoms. Document Released: 04/28/2002 Document Revised: 04/30/2011 Document Reviewed: 05/05/2010 [facility] Patient Information 2013 [place].    Domestic Abuse You are being battered or abused if someone close to you hits, pushes, or physically hurts you in any way. You also are being abused if you are forced into activities. You are being sexually abused if you are forced to have sexual contact of any kind. You are being emotionally abused if you are made to feel worthless or if you are constantly threatened. It is important to remember that help is available. No one has the right to abuse you. PREVENTION OF FURTHER  ABUSE  Learn the warning signs of danger. This varies with situations but may include: the use of alcohol, threats, isolation from friends and family, or forced sexual contact. Leave if you feel that violence is going to occur.  If you are attacked or beaten, report it to the police so the abuse is documented. You do not have to press charges. The police can protect you while you or the attackers are leaving. Get the officer's name and badge number and a copy of the report.  Find someone you can trust and tell them what is happening to you: your caregiver, a nurse, clergy member, close friend or family member. Feeling ashamed is natural, but remember that you have done nothing wrong. No one deserves abuse. Document Released:  02/03/2000 Document Revised: 04/30/2011 Document Reviewed: 04/13/2010 ExitCare Patient Information 2013 [employer].    How Much is Too Much Alcohol? Drinking too much alcohol can cause injury, accidents, and health problems. These types of problems can include:   Car crashes.  Falls.  Family fighting (domestic violence).  Drowning.  Fights.  Injuries.  Burns.  Damage to certain organs.  Having a baby with birth defects. ONE DRINK CAN BE TOO MUCH WHEN YOU ARE:  Working.  Pregnant or breastfeeding.  Taking medicines. Ask your doctor.  Driving or planning to drive. If you or someone you know has a drinking problem, get help from a doctor.  Document Released: 12/02/2008 Document Revised: 04/30/2011 Document Reviewed: 12/02/2008 ExitCare Patient Information 2013 [employer].   Smoking Hazards Smoking cigarettes is extremely bad for your health. Tobacco smoke has over 200 known poisons in it. There are over 60 chemicals in tobacco smoke that cause cancer. Some of the chemicals found in cigarette smoke include:   Cyanide.  Benzene.  Formaldehyde.  Methanol (wood alcohol).  Acetylene (fuel used in welding torches).  Ammonia. Cigarette smoke also contains the poisonous gases nitrogen oxide and carbon monoxide.  Cigarette smokers have an increased risk of many serious medical problems and Smoking causes approximately:  90% of all lung cancer deaths in men.  80% of all lung cancer deaths in women.  90% of deaths from chronic obstructive lung disease. Compared with nonsmokers, smoking increases the risk of:  Coronary heart disease by 2 to 4 times.  Stroke by 2 to 4 times.  Men developing lung cancer by 23 times.  Women developing lung cancer by 13 times.  Dying from chronic obstructive lung diseases by 12 times.  . Smoking is the most preventable cause of death and disease in our society.  WHY IS SMOKING ADDICTIVE?  Nicotine is the chemical  agent in tobacco that is capable of causing addiction or dependence.  When you smoke and inhale, nicotine is absorbed rapidly into the bloodstream through your lungs. Nicotine absorbed through the lungs is capable of creating a powerful addiction. Both inhaled and non-inhaled nicotine may be addictive.  Addiction studies of cigarettes and spit tobacco show that addiction to nicotine occurs mainly during the teen years, when young people begin using tobacco products. WHAT ARE THE BENEFITS OF QUITTING?  There are many health benefits to quitting smoking.   Likelihood of developing cancer and heart disease decreases. Health improvements are seen almost immediately.  Blood pressure, pulse rate, and breathing patterns start returning to normal soon after quitting. QUITTING SMOKING   American Lung Association - 1-800-LUNGUSA  American Cancer Society - 1-800-ACS-2345 Document Released: 03/15/2004 Document Revised: 04/30/2011 Document Reviewed: 11/17/2008 ExitCare Patient Information 2013 ExitCare,   LLC.   Stress Management Stress is a state of physical or mental tension that often results from changes in your life or normal routine. Some common causes of stress are:  Death of a loved one.  Injuries or severe illnesses.  Getting fired or changing jobs.  Moving into a new home. Other causes may be:  Sexual problems.  Business or financial losses.  Taking on a large debt.  Regular conflict with someone at home or at work.  Constant tiredness from lack of sleep. It is not just bad things that are stressful. It may be stressful to:  Win the lottery.  Get married.  Buy a new car. The amount of stress that can be easily tolerated varies from person to person. Changes generally cause stress, regardless of the types of change. Too much stress can affect your health. It may lead to physical or emotional problems. Too little stress (boredom) may also become stressful. SUGGESTIONS TO  REDUCE STRESS:  Talk things over with your family and friends. It often is helpful to share your concerns and worries. If you feel your problem is serious, you may want to get help from a professional counselor.  Consider your problems one at a time instead of lumping them all together. Trying to take care of everything at once may seem impossible. List all the things you need to do and then start with the most important one. Set a goal to accomplish 2 or 3 things each day. If you expect to do too many in a single day you will naturally fail, causing you to feel even more stressed.  Do not use alcohol or drugs to relieve stress. Although you may feel better for a short time, they do not remove the problems that caused the stress. They can also be habit forming.  Exercise regularly - at least 3 times per week. Physical exercise can help to relieve that "uptight" feeling and will relax you.  The shortest distance between despair and hope is often a good night's sleep.  Go to bed and get up on time allowing yourself time for appointments without being rushed.  Take a short "time-out" period from any stressful situation that occurs during the day. Close your eyes and take some deep breaths. Starting with the muscles in your face, tense them, hold it for a few seconds, then relax. Repeat this with the muscles in your neck, shoulders, hand, stomach, back and legs.  Take good care of yourself. Eat a balanced diet and get plenty of rest.  Schedule time for having fun. Take a break from your daily routine to relax. HOME CARE INSTRUCTIONS   Call if you feel overwhelmed by your problems and feel you can no longer manage them on your own.  Return immediately if you feel like hurting yourself or someone else. Document Released: 08/01/2000 Document Revised: 04/30/2011 Document Reviewed: 03/24/2007 ExitCare Patient Information 2013 ExitCare[employer].   

## 2015-11-30 LAB — RPR

## 2015-11-30 LAB — HIV ANTIBODY (ROUTINE TESTING W REFLEX): HIV: NONREACTIVE

## 2015-11-30 LAB — WET PREP BY MOLECULAR PROBE
CANDIDA SPECIES: NEGATIVE
Gardnerella vaginalis: NEGATIVE
TRICHOMONAS VAG: NEGATIVE

## 2015-12-01 LAB — IPS N GONORRHOEA AND CHLAMYDIA BY PCR

## 2015-12-06 NOTE — Progress Notes (Signed)
Encounter reviewed Salomon Ganser, MD   

## 2016-01-12 ENCOUNTER — Other Ambulatory Visit: Payer: Self-pay | Admitting: Certified Nurse Midwife

## 2016-01-12 DIAGNOSIS — Z3041 Encounter for surveillance of contraceptive pills: Secondary | ICD-10-CM

## 2016-03-28 ENCOUNTER — Other Ambulatory Visit: Payer: Self-pay | Admitting: Internal Medicine

## 2016-03-28 DIAGNOSIS — E282 Polycystic ovarian syndrome: Secondary | ICD-10-CM

## 2016-04-03 ENCOUNTER — Ambulatory Visit
Admission: RE | Admit: 2016-04-03 | Discharge: 2016-04-03 | Disposition: A | Discharge status: Home / Self Care | Payer: Managed Care, Other (non HMO) | Source: Ambulatory Visit | Attending: Internal Medicine | Admitting: Internal Medicine

## 2016-04-03 DIAGNOSIS — E282 Polycystic ovarian syndrome: Secondary | ICD-10-CM

## 2016-04-06 ENCOUNTER — Telehealth: Payer: Self-pay | Admitting: Certified Nurse Midwife

## 2016-04-06 NOTE — Telephone Encounter (Signed)
Deborah Leonard, CNM -see patient message below and advise?  

## 2016-04-06 NOTE — Telephone Encounter (Signed)
Will have Dr.Jertson review also prior to letting patient know. Ovaries and uterus appear normal.

## 2016-04-06 NOTE — Telephone Encounter (Signed)
Patient called stating that a ultrasound report is being faxed to Melvia Heaps, CNM. Patient is asking for Regina Eck thoughts on the results. (Reports are in [facility] office)

## 2016-04-10 NOTE — Telephone Encounter (Signed)
Reviewed PUS also with Dr. Talbert Nan all appear normal.

## 2016-04-10 NOTE — Telephone Encounter (Signed)
Left message to call Tanya Valencia at [phone]. 

## 2016-04-11 NOTE — Telephone Encounter (Signed)
Spoke with patient. Advised of results as seen below from [place]. Patient is agreeable and verbalizes understanding.  Routing to provider for final review. Patient agreeable to disposition. Will close encounter.

## 2016-04-17 ENCOUNTER — Other Ambulatory Visit: Payer: Self-pay | Admitting: Internal Medicine

## 2016-04-17 DIAGNOSIS — L680 Hirsutism: Secondary | ICD-10-CM

## 2016-04-19 ENCOUNTER — Inpatient Hospital Stay
Admission: RE | Admit: 2016-04-19 | Discharge: 2016-04-19 | Disposition: A | Discharge status: Home / Self Care | Payer: Managed Care, Other (non HMO) | Source: Ambulatory Visit | Attending: Internal Medicine | Admitting: Internal Medicine

## 2016-04-19 ENCOUNTER — Other Ambulatory Visit: Payer: Managed Care, Other (non HMO)

## 2016-04-24 ENCOUNTER — Ambulatory Visit
Admission: RE | Admit: 2016-04-24 | Discharge: 2016-04-24 | Disposition: A | Discharge status: Home / Self Care | Payer: Managed Care, Other (non HMO) | Source: Ambulatory Visit | Attending: Internal Medicine | Admitting: Internal Medicine

## 2016-04-24 DIAGNOSIS — L680 Hirsutism: Secondary | ICD-10-CM

## 2016-11-29 ENCOUNTER — Ambulatory Visit: Payer: Managed Care, Other (non HMO) | Admitting: Certified Nurse Midwife

## 2016-12-20 ENCOUNTER — Ambulatory Visit (INDEPENDENT_AMBULATORY_CARE_PROVIDER_SITE_OTHER): Payer: BC Managed Care – PPO | Admitting: Clinical

## 2016-12-20 DIAGNOSIS — F4323 Adjustment disorder with mixed anxiety and depressed mood: Secondary | ICD-10-CM | POA: Diagnosis not present

## 2017-01-06 ENCOUNTER — Other Ambulatory Visit: Payer: Self-pay | Admitting: Certified Nurse Midwife

## 2017-01-06 DIAGNOSIS — Z3041 Encounter for surveillance of contraceptive pills: Secondary | ICD-10-CM

## 2017-01-07 NOTE — Telephone Encounter (Signed)
Medication refill request: OCP  Last AEX:  11/29/15 DL  Next AEX: 01/08/17  Last MMG (if hormonal medication request): n/a Refill authorized: 11/29/15 #1package, 4 RF. Today, please advise.   Routing to covering provider.

## 2017-01-08 ENCOUNTER — Ambulatory Visit: Payer: BC Managed Care – PPO | Admitting: Certified Nurse Midwife

## 2017-01-08 ENCOUNTER — Other Ambulatory Visit: Payer: Self-pay

## 2017-01-08 ENCOUNTER — Other Ambulatory Visit (HOSPITAL_COMMUNITY)
Admission: RE | Admit: 2017-01-08 | Discharge: 2017-01-08 | Disposition: A | Discharge status: Home / Self Care | Payer: BC Managed Care – PPO | Source: Ambulatory Visit | Attending: Obstetrics & Gynecology | Admitting: Obstetrics & Gynecology

## 2017-01-08 ENCOUNTER — Ambulatory Visit: Payer: BC Managed Care – PPO | Admitting: Clinical

## 2017-01-08 ENCOUNTER — Encounter: Payer: Self-pay | Admitting: Certified Nurse Midwife

## 2017-01-08 VITALS — BP 120/80 | HR 60 | Resp 16 | Ht 63.25 in | Wt 133.0 lb

## 2017-01-08 DIAGNOSIS — Z124 Encounter for screening for malignant neoplasm of cervix: Secondary | ICD-10-CM | POA: Insufficient documentation

## 2017-01-08 DIAGNOSIS — Z01419 Encounter for gynecological examination (general) (routine) without abnormal findings: Secondary | ICD-10-CM

## 2017-01-08 DIAGNOSIS — Z8639 Personal history of other endocrine, nutritional and metabolic disease: Secondary | ICD-10-CM

## 2017-01-08 DIAGNOSIS — Z3041 Encounter for surveillance of contraceptive pills: Secondary | ICD-10-CM

## 2017-01-08 MED ORDER — LEVONORGEST-ETH ESTRAD 91-DAY 0.15-0.03 MG PO TABS: 1.0000 | ORAL_TABLET | Freq: Every day | ORAL | 4 refills | Status: DC

## 2017-01-08 NOTE — Progress Notes (Signed)
26 y.o. G0P0000 Single  Caucasian Fe here for annual exam. Periods normal, no issues. Contraception working well with extended use. Happy with use. Partner change and [facility] for STD screening. Happy with extended use OCP and no complaints. Sees PCP for thyroid management No health issues today.  Patient's last menstrual period was 10/11/2016 (exact date).          Sexually active: Yes.    The current method of family planning is OCP (estrogen/progesterone).    Exercising: Yes.    walk, run, yoga, gym Smoker:  no  Health Maintenance: Pap:  11-20-13 neg History of Abnormal Pap: no MMG:  none Self Breast exams: no Colonoscopy:  none BMD:   none TDaP:  2010 Shingles: no Pneumonia: no Hep C and HIV: HIV neg 2018 per patient Labs: none   reports that  has never smoked. she has never used smokeless tobacco. She reports that she drinks about 1.8 oz of alcohol per week. She reports that she does not use drugs.  Past Medical History:  Diagnosis Date  . Anxiety   . Depression   . Graves disease    goiter  . Hypothyroidism 2015  . Lyme disease 2008    Past Surgical History:  Procedure Laterality Date  . CYST EXCISION     from face 2018  . WISDOM TOOTH EXTRACTION      Current Outpatient Medications  Medication Sig Dispense Refill  . ACETAMINOPHEN PO Take by mouth as needed.    . doxycycline (VIBRA-TABS) 100 MG tablet TAKE 1 TABLET BY MOUTH TWICE A DAY PT TO RTO IF NEEDING MORE, MAY DSPN CAPSULES  1  . levonorgestrel-ethinyl estradiol (SEASONALE,INTROVALE,JOLESSA) 0.15-0.03 MG tablet TAKE 1 TABLET BY MOUTH DAILY. 91 tablet 0  . levothyroxine (SYNTHROID, LEVOTHROID) 112 MCG tablet TAKE 1 TABLET ON AN EMPTY STOMACH IN THE MORNING ONCE A DAY ORALLY 30 DAYS  5  . propranolol (INDERAL) 10 MG tablet Take 10 mg by mouth 2 (two) times daily.  4  . ranitidine (ZANTAC) 150 MG tablet TAKE 1-2 TABLET AT BEDTIME ONCE A DAY ORALLY 90 DAY(S)  1  . spironolactone (ALDACTONE) 100 MG  tablet      No current facility-administered medications for this visit.     Family History  Problem Relation Age of Onset  . Thyroid disease Father   . ALS Father   . Depression Sister     ROS:  Pertinent items are noted in HPI.  Otherwise, a comprehensive ROS was negative.  Exam:   BP 120/80   Pulse 60   Resp 16   Ht 5' 3.25" (1.607 m)   Wt 133 lb (60.3 kg)   LMP 10/11/2016 (Exact Date)   BMI 23.37 kg/m  Height: 5' 3.25" (160.7 cm) Ht Readings from Last 3 Encounters:  01/08/17 5' 3.25" (1.607 m)  11/29/15 5' 3.25" (1.607 m)  11/24/14 5' 3.75" (1.619 m)    General appearance: alert, cooperative and appears stated age Head: Normocephalic, without obvious abnormality, atraumatic Neck: no adenopathy, supple, symmetrical, trachea midline and thyroid normal to inspection and palpation Lungs: clear to auscultation bilaterally Breasts: normal appearance, no masses or tenderness, No nipple retraction or dimpling, No nipple discharge or bleeding, No axillary or supraclavicular adenopathy Heart: regular rate and rhythm Abdomen: soft, non-tender; no masses,  no organomegaly Extremities: extremities normal, atraumatic, no cyanosis or edema Skin: Skin color, texture, turgor normal. No rashes or lesions Lymph nodes: Cervical, supraclavicular, and axillary nodes normal. No abnormal  inguinal nodes palpated Neurologic: Grossly normal   Pelvic: External genitalia:  no lesions              Urethra:  normal appearing urethra with no masses, tenderness or lesions              Bartholin's and Skene's: normal                 Vagina: normal appearing vagina with normal color and discharge, no lesions              Cervix: no cervical motion tenderness, no lesions and nulliparous appearance              Pap taken: Yes.   Bimanual Exam:  Uterus:  normal size, contour, position, consistency, mobility, non-tender              Adnexa: normal adnexa and no mass, fullness, tenderness                Rectovaginal: Confirms               Anus:  normal appearance  Chaperone present: yes  A:  Well Woman with normal exam  Contraception OCP desired, extended use for cycle control  Hypothyroid/PCOS type symptoms with endocrine management, stable per patient  Recent removal of facial cyst      P:   Reviewed health and wellness pertinent to exam  Discussed risks/benefits and warning signs, request continuance of OCP.  Rx [place] see order with instructions  Continue follow up as indicated with MD  Pap smear: yes   counseled on breast self exam, STD prevention, HIV risk factors and prevention, use and side effects of OCP's, adequate intake of calcium and vitamin D, diet and exercise  return annually or prn  An After Visit Summary was printed and given to the patient.

## 2017-01-08 NOTE — Patient Instructions (Signed)
General topics  Next pap or exam is  due in 1 year Take a Women's multivitamin Take 1200 mg. of calcium daily - prefer dietary If any concerns in interim to call back  Breast Self-Awareness Practicing breast self-awareness may pick up problems early, prevent significant medical complications, and possibly save your life. By practicing breast self-awareness, you can become familiar with how your breasts look and feel and if your breasts are changing. This allows you to notice changes early. It can also offer you some reassurance that your breast health is good. One way to learn what is normal for your breasts and whether your breasts are changing is to do a breast self-exam. If you find a lump or something that was not present in the past, it is best to contact your caregiver right away. Other findings that should be evaluated by your caregiver include nipple discharge, especially if it is bloody; skin changes or reddening; areas where the skin seems to be pulled in (retracted); or new lumps and bumps. Breast pain is seldom associated with cancer (malignancy), but should also be evaluated by a caregiver. BREAST SELF-EXAM The best time to examine your breasts is 5 7 days after your menstrual period is over.  ExitCare Patient Information 2013 [employer].   Exercise to Stay Healthy Exercise helps you become and stay healthy. EXERCISE IDEAS AND TIPS Choose exercises that:  You enjoy.  Fit into your day. You do not need to exercise really hard to be healthy. You can do exercises at a slow or medium level and stay healthy. You can:  Stretch before and after working out.  Try yoga, Pilates, or tai chi.  Lift weights.  Walk fast, swim, jog, run, climb stairs, bicycle, dance, or rollerskate.  Take aerobic classes. Exercises that burn about 150 calories:  Running 1  miles in 15 minutes.  Playing volleyball for 45 to 60 minutes.  Washing and waxing a car for 45 to 60  minutes.  Playing touch football for 45 minutes.  Walking 1  miles in 35 minutes.  Pushing a stroller 1  miles in 30 minutes.  Playing basketball for 30 minutes.  Raking leaves for 30 minutes.  Bicycling 5 miles in 30 minutes.  Walking 2 miles in 30 minutes.  Dancing for 30 minutes.  Shoveling snow for 15 minutes.  Swimming laps for 20 minutes.  Walking up stairs for 15 minutes.  Bicycling 4 miles in 15 minutes.  Gardening for 30 to 45 minutes.  Jumping rope for 15 minutes.  Washing windows or floors for 45 to 60 minutes. Document Released: 03/10/2010 Document Revised: 04/30/2011 Document Reviewed: 03/10/2010 ExitCare Patient Information 2013 [employer].   Other topics ( that may be useful information):    Sexually Transmitted Disease Sexually transmitted disease (STD) refers to any infection that is passed from person to person during sexual activity. This may happen by way of saliva, semen, blood, vaginal mucus, or urine. Common STDs include:  Gonorrhea.  Chlamydia.  Syphilis.  HIV/AIDS.  Genital herpes.  Hepatitis B and C.  Trichomonas.  Human papillomavirus (HPV).  Pubic lice. CAUSES  An STD may be spread by bacteria, virus, or parasite. A person can get an STD by:  Sexual intercourse with an infected person.  Sharing sex toys with an infected person.  Sharing needles with an infected person.  Having intimate contact with the genitals, mouth, or rectal areas of an infected person. SYMPTOMS  Some people may not have any symptoms, but   they can still pass the infection to others. Different STDs have different symptoms. Symptoms include:  Painful or bloody urination.  Pain in the pelvis, abdomen, vagina, anus, throat, or eyes.  Skin rash, itching, irritation, growths, or sores (lesions). These usually occur in the genital or anal area.  Abnormal vaginal discharge.  Penile discharge in men.  Soft, flesh-colored skin growths in the  genital or anal area.  Fever.  Pain or bleeding during sexual intercourse.  Swollen glands in the groin area.  Yellow skin and eyes (jaundice). This is seen with hepatitis. DIAGNOSIS  To make a diagnosis, your caregiver may:  Take a medical history.  Perform a physical exam.  Take a specimen (culture) to be examined.  Examine a sample of discharge under a microscope.  Perform blood test TREATMENT   Chlamydia, gonorrhea, trichomonas, and syphilis can be cured with antibiotic medicine.  Genital herpes, hepatitis, and HIV can be treated, but not cured, with prescribed medicines. The medicines will lessen the symptoms.  Genital warts from HPV can be treated with medicine or by freezing, burning (electrocautery), or surgery. Warts may come back.  HPV is a virus and cannot be cured with medicine or surgery.However, abnormal areas may be followed very closely by your caregiver and may be removed from the cervix, vagina, or vulva through office procedures or surgery. If your diagnosis is confirmed, your recent sexual partners need treatment. This is true even if they are symptom-free or have a negative culture or evaluation. They should not have sex until their caregiver says it is okay. HOME CARE INSTRUCTIONS  All sexual partners should be informed, tested, and treated for all STDs.  Take your antibiotics as directed. Finish them even if you start to feel better.  Only take over-the-counter or prescription medicines for pain, discomfort, or fever as directed by your caregiver.  Rest.  Eat a balanced diet and drink enough fluids to keep your urine clear or pale yellow.  Do not have sex until treatment is completed and you have followed up with your caregiver. STDs should be checked after treatment.  Keep all follow-up appointments, Pap tests, and blood tests as directed by your caregiver.  Only use latex condoms and water-soluble lubricants during sexual activity. Do not use  petroleum jelly or oils.  Avoid alcohol and illegal drugs.  Get vaccinated for HPV and hepatitis. If you have not received these vaccines in the past, talk to your caregiver about whether one or both might be right for you.  Avoid risky sex practices that can break the skin. The only way to avoid getting an STD is to avoid all sexual activity.Latex condoms and dental dams (for oral sex) will help lessen the risk of getting an STD, but will not completely eliminate the risk. SEEK MEDICAL CARE IF:   You have a fever.  You have any new or worsening symptoms. Document Released: 04/28/2002 Document Revised: 04/30/2011 Document Reviewed: 05/05/2010 [facility] Patient Information 2013 [place].    Domestic Abuse You are being battered or abused if someone close to you hits, pushes, or physically hurts you in any way. You also are being abused if you are forced into activities. You are being sexually abused if you are forced to have sexual contact of any kind. You are being emotionally abused if you are made to feel worthless or if you are constantly threatened. It is important to remember that help is available. No one has the right to abuse you. PREVENTION OF FURTHER  ABUSE  Learn the warning signs of danger. This varies with situations but may include: the use of alcohol, threats, isolation from friends and family, or forced sexual contact. Leave if you feel that violence is going to occur.  If you are attacked or beaten, report it to the police so the abuse is documented. You do not have to press charges. The police can protect you while you or the attackers are leaving. Get the officer's name and badge number and a copy of the report.  Find someone you can trust and tell them what is happening to you: your caregiver, a nurse, clergy member, close friend or family member. Feeling ashamed is natural, but remember that you have done nothing wrong. No one deserves abuse. Document Released:  02/03/2000 Document Revised: 04/30/2011 Document Reviewed: 04/13/2010 ExitCare Patient Information 2013 [employer].    How Much is Too Much Alcohol? Drinking too much alcohol can cause injury, accidents, and health problems. These types of problems can include:   Car crashes.  Falls.  Family fighting (domestic violence).  Drowning.  Fights.  Injuries.  Burns.  Damage to certain organs.  Having a baby with birth defects. ONE DRINK CAN BE TOO MUCH WHEN YOU ARE:  Working.  Pregnant or breastfeeding.  Taking medicines. Ask your doctor.  Driving or planning to drive. If you or someone you know has a drinking problem, get help from a doctor.  Document Released: 12/02/2008 Document Revised: 04/30/2011 Document Reviewed: 12/02/2008 ExitCare Patient Information 2013 [employer].   Smoking Hazards Smoking cigarettes is extremely bad for your health. Tobacco smoke has over 200 known poisons in it. There are over 60 chemicals in tobacco smoke that cause cancer. Some of the chemicals found in cigarette smoke include:   Cyanide.  Benzene.  Formaldehyde.  Methanol (wood alcohol).  Acetylene (fuel used in welding torches).  Ammonia. Cigarette smoke also contains the poisonous gases nitrogen oxide and carbon monoxide.  Cigarette smokers have an increased risk of many serious medical problems and Smoking causes approximately:  90% of all lung cancer deaths in men.  80% of all lung cancer deaths in women.  90% of deaths from chronic obstructive lung disease. Compared with nonsmokers, smoking increases the risk of:  Coronary heart disease by 2 to 4 times.  Stroke by 2 to 4 times.  Men developing lung cancer by 23 times.  Women developing lung cancer by 13 times.  Dying from chronic obstructive lung diseases by 12 times.  . Smoking is the most preventable cause of death and disease in our society.  WHY IS SMOKING ADDICTIVE?  Nicotine is the chemical  agent in tobacco that is capable of causing addiction or dependence.  When you smoke and inhale, nicotine is absorbed rapidly into the bloodstream through your lungs. Nicotine absorbed through the lungs is capable of creating a powerful addiction. Both inhaled and non-inhaled nicotine may be addictive.  Addiction studies of cigarettes and spit tobacco show that addiction to nicotine occurs mainly during the teen years, when young people begin using tobacco products. WHAT ARE THE BENEFITS OF QUITTING?  There are many health benefits to quitting smoking.   Likelihood of developing cancer and heart disease decreases. Health improvements are seen almost immediately.  Blood pressure, pulse rate, and breathing patterns start returning to normal soon after quitting. QUITTING SMOKING   American Lung Association - 1-800-LUNGUSA  American Cancer Society - 1-800-ACS-2345 Document Released: 03/15/2004 Document Revised: 04/30/2011 Document Reviewed: 11/17/2008 ExitCare Patient Information 2013 ExitCare,   LLC.   Stress Management Stress is a state of physical or mental tension that often results from changes in your life or normal routine. Some common causes of stress are:  Death of a loved one.  Injuries or severe illnesses.  Getting fired or changing jobs.  Moving into a new home. Other causes may be:  Sexual problems.  Business or financial losses.  Taking on a large debt.  Regular conflict with someone at home or at work.  Constant tiredness from lack of sleep. It is not just bad things that are stressful. It may be stressful to:  Win the lottery.  Get married.  Buy a new car. The amount of stress that can be easily tolerated varies from person to person. Changes generally cause stress, regardless of the types of change. Too much stress can affect your health. It may lead to physical or emotional problems. Too little stress (boredom) may also become stressful. SUGGESTIONS TO  REDUCE STRESS:  Talk things over with your family and friends. It often is helpful to share your concerns and worries. If you feel your problem is serious, you may want to get help from a professional counselor.  Consider your problems one at a time instead of lumping them all together. Trying to take care of everything at once may seem impossible. List all the things you need to do and then start with the most important one. Set a goal to accomplish 2 or 3 things each day. If you expect to do too many in a single day you will naturally fail, causing you to feel even more stressed.  Do not use alcohol or drugs to relieve stress. Although you may feel better for a short time, they do not remove the problems that caused the stress. They can also be habit forming.  Exercise regularly - at least 3 times per week. Physical exercise can help to relieve that "uptight" feeling and will relax you.  The shortest distance between despair and hope is often a good night's sleep.  Go to bed and get up on time allowing yourself time for appointments without being rushed.  Take a short "time-out" period from any stressful situation that occurs during the day. Close your eyes and take some deep breaths. Starting with the muscles in your face, tense them, hold it for a few seconds, then relax. Repeat this with the muscles in your neck, shoulders, hand, stomach, back and legs.  Take good care of yourself. Eat a balanced diet and get plenty of rest.  Schedule time for having fun. Take a break from your daily routine to relax. HOME CARE INSTRUCTIONS   Call if you feel overwhelmed by your problems and feel you can no longer manage them on your own.  Return immediately if you feel like hurting yourself or someone else. Document Released: 08/01/2000 Document Revised: 04/30/2011 Document Reviewed: 03/24/2007 ExitCare Patient Information 2013 ExitCare[employer].   

## 2017-01-15 LAB — CYTOLOGY - PAP: DIAGNOSIS: NEGATIVE

## 2017-01-31 ENCOUNTER — Ambulatory Visit: Payer: BC Managed Care – PPO | Admitting: Clinical

## 2017-02-04 ENCOUNTER — Ambulatory Visit (INDEPENDENT_AMBULATORY_CARE_PROVIDER_SITE_OTHER): Payer: BC Managed Care – PPO | Admitting: Clinical

## 2017-02-04 DIAGNOSIS — F4323 Adjustment disorder with mixed anxiety and depressed mood: Secondary | ICD-10-CM | POA: Diagnosis not present

## 2017-02-06 ENCOUNTER — Other Ambulatory Visit: Payer: Self-pay | Admitting: Internal Medicine

## 2017-02-06 ENCOUNTER — Ambulatory Visit
Admission: RE | Admit: 2017-02-06 | Discharge: 2017-02-06 | Disposition: A | Discharge status: Home / Self Care | Payer: BC Managed Care – PPO | Source: Ambulatory Visit | Attending: Internal Medicine | Admitting: Internal Medicine

## 2017-02-06 DIAGNOSIS — R079 Chest pain, unspecified: Secondary | ICD-10-CM

## 2017-02-26 ENCOUNTER — Ambulatory Visit: Payer: BC Managed Care – PPO | Admitting: Clinical

## 2017-02-26 DIAGNOSIS — F4323 Adjustment disorder with mixed anxiety and depressed mood: Secondary | ICD-10-CM | POA: Diagnosis not present

## 2017-03-21 ENCOUNTER — Ambulatory Visit: Payer: BC Managed Care – PPO | Admitting: Clinical

## 2017-03-21 DIAGNOSIS — F4323 Adjustment disorder with mixed anxiety and depressed mood: Secondary | ICD-10-CM | POA: Diagnosis not present

## 2017-04-11 ENCOUNTER — Ambulatory Visit: Payer: BC Managed Care – PPO | Admitting: Clinical

## 2017-04-11 DIAGNOSIS — F4323 Adjustment disorder with mixed anxiety and depressed mood: Secondary | ICD-10-CM | POA: Diagnosis not present

## 2017-04-18 ENCOUNTER — Telehealth: Payer: Self-pay | Admitting: Certified Nurse Midwife

## 2017-04-18 NOTE — Telephone Encounter (Signed)
Spoke with patient, seen by cardiology today for HTN , was advised to stop OCP. Has echo scheduled for 3/4. Patient asking when she can stop OCP.   Advised patient to stop OCP now, menses may start. Use BUM for contraceptive, recommended OV for further discussion of alternatives. OV scheduled for 04/25/17 at 8am with Melvia Heaps, CNM. Patient states she has requested copy of cardiology visit to be forwarded to Melvia Heaps, CNM.   Routing to provider for final review. Patient is agreeable to disposition. Will close encounter.

## 2017-04-18 NOTE — Telephone Encounter (Signed)
Patient was seen by a cardiologist today and was told to stop taking her birth control.

## 2017-04-22 ENCOUNTER — Ambulatory Visit (HOSPITAL_COMMUNITY)
Admission: RE | Admit: 2017-04-22 | Discharge: 2017-04-22 | Disposition: A | Discharge status: Home / Self Care | Payer: BC Managed Care – PPO | Source: Ambulatory Visit | Attending: Vascular Surgery | Admitting: Vascular Surgery

## 2017-04-22 ENCOUNTER — Other Ambulatory Visit: Payer: Self-pay | Admitting: Cardiology

## 2017-04-22 DIAGNOSIS — I10 Essential (primary) hypertension: Secondary | ICD-10-CM | POA: Diagnosis not present

## 2017-04-25 ENCOUNTER — Other Ambulatory Visit: Payer: Self-pay

## 2017-04-25 ENCOUNTER — Ambulatory Visit: Payer: BC Managed Care – PPO | Admitting: Certified Nurse Midwife

## 2017-04-25 ENCOUNTER — Encounter: Payer: Self-pay | Admitting: Certified Nurse Midwife

## 2017-04-25 VITALS — BP 138/100 | HR 80 | Resp 16 | Ht 63.25 in | Wt 133.0 lb

## 2017-04-25 DIAGNOSIS — Z3009 Encounter for other general counseling and advice on contraception: Secondary | ICD-10-CM | POA: Diagnosis not present

## 2017-04-25 NOTE — Progress Notes (Signed)
Subjective:     Patient ID: Tanya Valencia, female   DOB: [birth year], 27 y.o.   MRN: [id number]  Patient here today to discuss contraception options. Previous use of OCP for several years with good cycle control and no issues. Last year developed hypertension and taken off OCP. Currently using condoms. Patient seeing cardiology and endocrine to determine cause of hypertension, still uncontrolled on medication. Being evaluated for adrenal cause.  Patient feeling well but anxious about "all of this". Here for consult only.     Review of Systems  Constitutional: Negative.   Gastrointestinal: Negative.   Genitourinary: Negative.   Neurological: Negative.   Psychiatric/Behavioral: The patient is nervous/anxious.        Objective:   Physical Exam  Constitutional: She is oriented to person, place, and time. She appears well-developed and well-nourished.  Neurological: She is alert and oriented to person, place, and time.  Psychiatric: She has a normal mood and affect. Her behavior is normal. Judgment and thought content normal.       Assessment:     Hypertension on medication uncontrolled at present under evaluation, ? Adrenal Contraception previous OCP, condoms now Contraception management options desired    Plan:     Discussed importance of follow up regarding hypertension and etiology of not using OCP and warning with hypertension. Discussed  Paragard,Kyleena IUD, Nexplanon insertion/removal, risks,benefits,warning signs and bleeding expectations. Psychiatrist given to check benefits. Shown both types of IUD. Discussed will need to be in on menses day 1-5 and no concerns for pregnancy. Discussed condom use, diaphragm, OTC sponge along with tracking cycle and fertile times to avoid. Questions answered at length regarding all options. Given booklet on prevention rates and options. Patient will continue condom use until etiology of hypertension determined and will  call when change desired.  Rv prn as above  35 minutes in time regarding contraception options and management

## 2017-04-26 ENCOUNTER — Ambulatory Visit
Admission: RE | Admit: 2017-04-26 | Discharge: 2017-04-26 | Disposition: A | Discharge status: Home / Self Care | Payer: BC Managed Care – PPO | Source: Ambulatory Visit | Attending: Cardiology | Admitting: Cardiology

## 2017-04-26 ENCOUNTER — Other Ambulatory Visit: Payer: Self-pay | Admitting: Cardiology

## 2017-04-26 DIAGNOSIS — R0602 Shortness of breath: Secondary | ICD-10-CM

## 2017-05-01 ENCOUNTER — Ambulatory Visit: Payer: BC Managed Care – PPO | Admitting: Clinical

## 2017-05-01 DIAGNOSIS — F4323 Adjustment disorder with mixed anxiety and depressed mood: Secondary | ICD-10-CM | POA: Diagnosis not present

## 2017-05-02 ENCOUNTER — Other Ambulatory Visit (HOSPITAL_COMMUNITY): Payer: Self-pay | Admitting: Internal Medicine

## 2017-05-02 DIAGNOSIS — R112 Nausea with vomiting, unspecified: Secondary | ICD-10-CM

## 2017-05-02 DIAGNOSIS — R825 Elevated urine levels of drugs, medicaments and biological substances: Secondary | ICD-10-CM

## 2017-05-02 DIAGNOSIS — R109 Unspecified abdominal pain: Secondary | ICD-10-CM

## 2017-05-02 DIAGNOSIS — D3500 Benign neoplasm of unspecified adrenal gland: Secondary | ICD-10-CM

## 2017-05-03 ENCOUNTER — Encounter (HOSPITAL_COMMUNITY): Payer: Self-pay | Admitting: Radiology

## 2017-05-03 ENCOUNTER — Ambulatory Visit (HOSPITAL_COMMUNITY)
Admission: RE | Admit: 2017-05-03 | Discharge: 2017-05-03 | Disposition: A | Discharge status: Home / Self Care | Payer: BC Managed Care – PPO | Source: Ambulatory Visit | Attending: Internal Medicine | Admitting: Internal Medicine

## 2017-05-03 DIAGNOSIS — R112 Nausea with vomiting, unspecified: Secondary | ICD-10-CM | POA: Insufficient documentation

## 2017-05-03 DIAGNOSIS — R109 Unspecified abdominal pain: Secondary | ICD-10-CM | POA: Diagnosis present

## 2017-05-03 DIAGNOSIS — R825 Elevated urine levels of drugs, medicaments and biological substances: Secondary | ICD-10-CM

## 2017-05-03 DIAGNOSIS — D3500 Benign neoplasm of unspecified adrenal gland: Secondary | ICD-10-CM | POA: Insufficient documentation

## 2017-05-03 MED ORDER — IOPAMIDOL (ISOVUE-300) INJECTION 61%
INTRAVENOUS | Status: AC
  Administered 2017-05-03: 100 mL
  Filled 2017-05-03: qty 100

## 2017-05-09 ENCOUNTER — Other Ambulatory Visit: Payer: Self-pay | Admitting: Internal Medicine

## 2017-05-09 DIAGNOSIS — R079 Chest pain, unspecified: Secondary | ICD-10-CM

## 2017-05-09 DIAGNOSIS — E0500 Thyrotoxicosis with diffuse goiter without thyrotoxic crisis or storm: Secondary | ICD-10-CM

## 2017-05-09 DIAGNOSIS — R102 Pelvic and perineal pain: Secondary | ICD-10-CM

## 2017-05-09 DIAGNOSIS — R825 Elevated urine levels of drugs, medicaments and biological substances: Secondary | ICD-10-CM

## 2017-05-22 ENCOUNTER — Ambulatory Visit
Admission: RE | Admit: 2017-05-22 | Discharge: 2017-05-22 | Disposition: A | Discharge status: Home / Self Care | Payer: BC Managed Care – PPO | Source: Ambulatory Visit | Attending: Internal Medicine | Admitting: Internal Medicine

## 2017-05-22 DIAGNOSIS — R079 Chest pain, unspecified: Secondary | ICD-10-CM

## 2017-05-22 DIAGNOSIS — R825 Elevated urine levels of drugs, medicaments and biological substances: Secondary | ICD-10-CM

## 2017-05-22 DIAGNOSIS — R102 Pelvic and perineal pain: Secondary | ICD-10-CM

## 2017-05-22 DIAGNOSIS — E0500 Thyrotoxicosis with diffuse goiter without thyrotoxic crisis or storm: Secondary | ICD-10-CM

## 2017-05-22 MED ORDER — IOPAMIDOL (ISOVUE-300) INJECTION 61%
100.0000 mL | Freq: Once | INTRAVENOUS | Status: AC | PRN
  Administered 2017-05-22: 100 mL via INTRAVENOUS

## 2017-05-29 ENCOUNTER — Ambulatory Visit: Payer: BC Managed Care – PPO | Admitting: Clinical

## 2017-05-29 DIAGNOSIS — F4323 Adjustment disorder with mixed anxiety and depressed mood: Secondary | ICD-10-CM

## 2017-07-10 ENCOUNTER — Ambulatory Visit: Payer: BC Managed Care – PPO | Admitting: Clinical

## 2017-07-10 DIAGNOSIS — F4323 Adjustment disorder with mixed anxiety and depressed mood: Secondary | ICD-10-CM

## 2017-08-21 ENCOUNTER — Ambulatory Visit: Payer: BC Managed Care – PPO | Admitting: Clinical

## 2017-08-21 DIAGNOSIS — F4323 Adjustment disorder with mixed anxiety and depressed mood: Secondary | ICD-10-CM | POA: Diagnosis not present

## 2017-09-30 ENCOUNTER — Ambulatory Visit: Payer: BC Managed Care – PPO | Admitting: Clinical

## 2017-09-30 DIAGNOSIS — F4323 Adjustment disorder with mixed anxiety and depressed mood: Secondary | ICD-10-CM | POA: Diagnosis not present

## 2017-10-30 ENCOUNTER — Ambulatory Visit: Payer: BC Managed Care – PPO | Admitting: Clinical

## 2017-10-30 DIAGNOSIS — F4323 Adjustment disorder with mixed anxiety and depressed mood: Secondary | ICD-10-CM | POA: Diagnosis not present

## 2017-12-05 ENCOUNTER — Ambulatory Visit: Payer: BC Managed Care – PPO | Admitting: Clinical

## 2017-12-05 DIAGNOSIS — F4323 Adjustment disorder with mixed anxiety and depressed mood: Secondary | ICD-10-CM

## 2018-01-07 ENCOUNTER — Ambulatory Visit: Payer: BC Managed Care – PPO | Admitting: Clinical

## 2018-01-07 DIAGNOSIS — F419 Anxiety disorder, unspecified: Secondary | ICD-10-CM | POA: Diagnosis not present

## 2018-01-21 ENCOUNTER — Other Ambulatory Visit: Payer: Self-pay

## 2018-01-21 ENCOUNTER — Ambulatory Visit: Payer: BC Managed Care – PPO | Admitting: Certified Nurse Midwife

## 2018-01-21 ENCOUNTER — Encounter: Payer: Self-pay | Admitting: Certified Nurse Midwife

## 2018-01-21 VITALS — BP 120/78 | HR 64 | Resp 16 | Ht 63.5 in | Wt 142.0 lb

## 2018-01-21 DIAGNOSIS — B009 Herpesviral infection, unspecified: Secondary | ICD-10-CM | POA: Diagnosis not present

## 2018-01-21 DIAGNOSIS — Z8679 Personal history of other diseases of the circulatory system: Secondary | ICD-10-CM

## 2018-01-21 DIAGNOSIS — Z01419 Encounter for gynecological examination (general) (routine) without abnormal findings: Secondary | ICD-10-CM

## 2018-01-21 MED ORDER — ACYCLOVIR 5 % EX OINT: TOPICAL_OINTMENT | CUTANEOUS | 2 refills | Status: DC

## 2018-01-21 NOTE — Patient Instructions (Signed)
General topics  Next pap or exam is  due in 1 year Take a Women's multivitamin Take 1200 mg. of calcium daily - prefer dietary If any concerns in interim to call back  Breast Self-Awareness Practicing breast self-awareness may pick up problems early, prevent significant medical complications, and possibly save your life. By practicing breast self-awareness, you can become familiar with how your breasts look and feel and if your breasts are changing. This allows you to notice changes early. It can also offer you some reassurance that your breast health is good. One way to learn what is normal for your breasts and whether your breasts are changing is to do a breast self-exam. If you find a lump or something that was not present in the past, it is best to contact your caregiver right away. Other findings that should be evaluated by your caregiver include nipple discharge, especially if it is bloody; skin changes or reddening; areas where the skin seems to be pulled in (retracted); or new lumps and bumps. Breast pain is seldom associated with cancer (malignancy), but should also be evaluated by a caregiver. BREAST SELF-EXAM The best time to examine your breasts is 5 7 days after your menstrual period is over.  ExitCare Patient Information 2013 [employer].   Exercise to Stay Healthy Exercise helps you become and stay healthy. EXERCISE IDEAS AND TIPS Choose exercises that:  You enjoy.  Fit into your day. You do not need to exercise really hard to be healthy. You can do exercises at a slow or medium level and stay healthy. You can:  Stretch before and after working out.  Try yoga, Pilates, or tai chi.  Lift weights.  Walk fast, swim, jog, run, climb stairs, bicycle, dance, or rollerskate.  Take aerobic classes. Exercises that burn about 150 calories:  Running 1  miles in 15 minutes.  Playing volleyball for 45 to 60 minutes.  Washing and waxing a car for 45 to 60  minutes.  Playing touch football for 45 minutes.  Walking 1  miles in 35 minutes.  Pushing a stroller 1  miles in 30 minutes.  Playing basketball for 30 minutes.  Raking leaves for 30 minutes.  Bicycling 5 miles in 30 minutes.  Walking 2 miles in 30 minutes.  Dancing for 30 minutes.  Shoveling snow for 15 minutes.  Swimming laps for 20 minutes.  Walking up stairs for 15 minutes.  Bicycling 4 miles in 15 minutes.  Gardening for 30 to 45 minutes.  Jumping rope for 15 minutes.  Washing windows or floors for 45 to 60 minutes. Document Released: 03/10/2010 Document Revised: 04/30/2011 Document Reviewed: 03/10/2010 ExitCare Patient Information 2013 [employer].   Other topics ( that may be useful information):    Sexually Transmitted Disease Sexually transmitted disease (STD) refers to any infection that is passed from person to person during sexual activity. This may happen by way of saliva, semen, blood, vaginal mucus, or urine. Common STDs include:  Gonorrhea.  Chlamydia.  Syphilis.  HIV/AIDS.  Genital herpes.  Hepatitis B and C.  Trichomonas.  Human papillomavirus (HPV).  Pubic lice. CAUSES  An STD may be spread by bacteria, virus, or parasite. A person can get an STD by:  Sexual intercourse with an infected person.  Sharing sex toys with an infected person.  Sharing needles with an infected person.  Having intimate contact with the genitals, mouth, or rectal areas of an infected person. SYMPTOMS  Some people may not have any symptoms, but   they can still pass the infection to others. Different STDs have different symptoms. Symptoms include:  Painful or bloody urination.  Pain in the pelvis, abdomen, vagina, anus, throat, or eyes.  Skin rash, itching, irritation, growths, or sores (lesions). These usually occur in the genital or anal area.  Abnormal vaginal discharge.  Penile discharge in men.  Soft, flesh-colored skin growths in the  genital or anal area.  Fever.  Pain or bleeding during sexual intercourse.  Swollen glands in the groin area.  Yellow skin and eyes (jaundice). This is seen with hepatitis. DIAGNOSIS  To make a diagnosis, your caregiver may:  Take a medical history.  Perform a physical exam.  Take a specimen (culture) to be examined.  Examine a sample of discharge under a microscope.  Perform blood test TREATMENT   Chlamydia, gonorrhea, trichomonas, and syphilis can be cured with antibiotic medicine.  Genital herpes, hepatitis, and HIV can be treated, but not cured, with prescribed medicines. The medicines will lessen the symptoms.  Genital warts from HPV can be treated with medicine or by freezing, burning (electrocautery), or surgery. Warts may come back.  HPV is a virus and cannot be cured with medicine or surgery.However, abnormal areas may be followed very closely by your caregiver and may be removed from the cervix, vagina, or vulva through office procedures or surgery. If your diagnosis is confirmed, your recent sexual partners need treatment. This is true even if they are symptom-free or have a negative culture or evaluation. They should not have sex until their caregiver says it is okay. HOME CARE INSTRUCTIONS  All sexual partners should be informed, tested, and treated for all STDs.  Take your antibiotics as directed. Finish them even if you start to feel better.  Only take over-the-counter or prescription medicines for pain, discomfort, or fever as directed by your caregiver.  Rest.  Eat a balanced diet and drink enough fluids to keep your urine clear or pale yellow.  Do not have sex until treatment is completed and you have followed up with your caregiver. STDs should be checked after treatment.  Keep all follow-up appointments, Pap tests, and blood tests as directed by your caregiver.  Only use latex condoms and water-soluble lubricants during sexual activity. Do not use  petroleum jelly or oils.  Avoid alcohol and illegal drugs.  Get vaccinated for HPV and hepatitis. If you have not received these vaccines in the past, talk to your caregiver about whether one or both might be right for you.  Avoid risky sex practices that can break the skin. The only way to avoid getting an STD is to avoid all sexual activity.Latex condoms and dental dams (for oral sex) will help lessen the risk of getting an STD, but will not completely eliminate the risk. SEEK MEDICAL CARE IF:   You have a fever.  You have any new or worsening symptoms. Document Released: 04/28/2002 Document Revised: 04/30/2011 Document Reviewed: 05/05/2010 [facility] Patient Information 2013 [place].    Domestic Abuse You are being battered or abused if someone close to you hits, pushes, or physically hurts you in any way. You also are being abused if you are forced into activities. You are being sexually abused if you are forced to have sexual contact of any kind. You are being emotionally abused if you are made to feel worthless or if you are constantly threatened. It is important to remember that help is available. No one has the right to abuse you. PREVENTION OF FURTHER  ABUSE  Learn the warning signs of danger. This varies with situations but may include: the use of alcohol, threats, isolation from friends and family, or forced sexual contact. Leave if you feel that violence is going to occur.  If you are attacked or beaten, report it to the police so the abuse is documented. You do not have to press charges. The police can protect you while you or the attackers are leaving. Get the officer's name and badge number and a copy of the report.  Find someone you can trust and tell them what is happening to you: your caregiver, a nurse, clergy member, close friend or family member. Feeling ashamed is natural, but remember that you have done nothing wrong. No one deserves abuse. Document Released:  02/03/2000 Document Revised: 04/30/2011 Document Reviewed: 04/13/2010 ExitCare Patient Information 2013 [employer].    How Much is Too Much Alcohol? Drinking too much alcohol can cause injury, accidents, and health problems. These types of problems can include:   Car crashes.  Falls.  Family fighting (domestic violence).  Drowning.  Fights.  Injuries.  Burns.  Damage to certain organs.  Having a baby with birth defects. ONE DRINK CAN BE TOO MUCH WHEN YOU ARE:  Working.  Pregnant or breastfeeding.  Taking medicines. Ask your doctor.  Driving or planning to drive. If you or someone you know has a drinking problem, get help from a doctor.  Document Released: 12/02/2008 Document Revised: 04/30/2011 Document Reviewed: 12/02/2008 ExitCare Patient Information 2013 [employer].   Smoking Hazards Smoking cigarettes is extremely bad for your health. Tobacco smoke has over 200 known poisons in it. There are over 60 chemicals in tobacco smoke that cause cancer. Some of the chemicals found in cigarette smoke include:   Cyanide.  Benzene.  Formaldehyde.  Methanol (wood alcohol).  Acetylene (fuel used in welding torches).  Ammonia. Cigarette smoke also contains the poisonous gases nitrogen oxide and carbon monoxide.  Cigarette smokers have an increased risk of many serious medical problems and Smoking causes approximately:  90% of all lung cancer deaths in men.  80% of all lung cancer deaths in women.  90% of deaths from chronic obstructive lung disease. Compared with nonsmokers, smoking increases the risk of:  Coronary heart disease by 2 to 4 times.  Stroke by 2 to 4 times.  Men developing lung cancer by 23 times.  Women developing lung cancer by 13 times.  Dying from chronic obstructive lung diseases by 12 times.  . Smoking is the most preventable cause of death and disease in our society.  WHY IS SMOKING ADDICTIVE?  Nicotine is the chemical  agent in tobacco that is capable of causing addiction or dependence.  When you smoke and inhale, nicotine is absorbed rapidly into the bloodstream through your lungs. Nicotine absorbed through the lungs is capable of creating a powerful addiction. Both inhaled and non-inhaled nicotine may be addictive.  Addiction studies of cigarettes and spit tobacco show that addiction to nicotine occurs mainly during the teen years, when young people begin using tobacco products. WHAT ARE THE BENEFITS OF QUITTING?  There are many health benefits to quitting smoking.   Likelihood of developing cancer and heart disease decreases. Health improvements are seen almost immediately.  Blood pressure, pulse rate, and breathing patterns start returning to normal soon after quitting. QUITTING SMOKING   American Lung Association - 1-800-LUNGUSA  American Cancer Society - 1-800-ACS-2345 Document Released: 03/15/2004 Document Revised: 04/30/2011 Document Reviewed: 11/17/2008 ExitCare Patient Information 2013 ExitCare,   LLC.   Stress Management Stress is a state of physical or mental tension that often results from changes in your life or normal routine. Some common causes of stress are:  Death of a loved one.  Injuries or severe illnesses.  Getting fired or changing jobs.  Moving into a new home. Other causes may be:  Sexual problems.  Business or financial losses.  Taking on a large debt.  Regular conflict with someone at home or at work.  Constant tiredness from lack of sleep. It is not just bad things that are stressful. It may be stressful to:  Win the lottery.  Get married.  Buy a new car. The amount of stress that can be easily tolerated varies from person to person. Changes generally cause stress, regardless of the types of change. Too much stress can affect your health. It may lead to physical or emotional problems. Too little stress (boredom) may also become stressful. SUGGESTIONS TO  REDUCE STRESS:  Talk things over with your family and friends. It often is helpful to share your concerns and worries. If you feel your problem is serious, you may want to get help from a professional counselor.  Consider your problems one at a time instead of lumping them all together. Trying to take care of everything at once may seem impossible. List all the things you need to do and then start with the most important one. Set a goal to accomplish 2 or 3 things each day. If you expect to do too many in a single day you will naturally fail, causing you to feel even more stressed.  Do not use alcohol or drugs to relieve stress. Although you may feel better for a short time, they do not remove the problems that caused the stress. They can also be habit forming.  Exercise regularly - at least 3 times per week. Physical exercise can help to relieve that "uptight" feeling and will relax you.  The shortest distance between despair and hope is often a good night's sleep.  Go to bed and get up on time allowing yourself time for appointments without being rushed.  Take a short "time-out" period from any stressful situation that occurs during the day. Close your eyes and take some deep breaths. Starting with the muscles in your face, tense them, hold it for a few seconds, then relax. Repeat this with the muscles in your neck, shoulders, hand, stomach, back and legs.  Take good care of yourself. Eat a balanced diet and get plenty of rest.  Schedule time for having fun. Take a break from your daily routine to relax. HOME CARE INSTRUCTIONS   Call if you feel overwhelmed by your problems and feel you can no longer manage them on your own.  Return immediately if you feel like hurting yourself or someone else. Document Released: 08/01/2000 Document Revised: 04/30/2011 Document Reviewed: 03/24/2007 ExitCare Patient Information 2013 ExitCare[employer].   

## 2018-01-21 NOTE — Progress Notes (Signed)
27 y.o. G0P0000 Single  Caucasian Fe here for annual exam. Periods normal, once stopping OCP due to BP elevation. Considering Paragard IUD, but not at this time. Would also be interested in diaphragm if available. Partner change and had free STD screening, all negative per patient. Will advise if desires change in contraception. Occasional vaginal odor after sexual activity or period, no vaginal infection concerns or symptoms. Sees PCP hypertension, hypothyroid management. No other health issues today.  LMP: 01/06/18     Sexually active: Yes.    The current method of family planning is condoms most of the time.   Exercising: Yes.    gym & yoga Smoker:  no  Review of Systems  Constitutional: Negative.   HENT: Negative.   Eyes: Negative.   Respiratory: Negative.   Cardiovascular: Negative.   Gastrointestinal: Negative.   Genitourinary: Negative.   Musculoskeletal: Negative.   Skin: Negative.   Neurological: Negative.   Endo/Heme/Allergies: Negative.   Psychiatric/Behavioral: Negative.     Health Maintenance: Pap:  01-08-17 neg History of Abnormal Pap: no MMG:  none Self Breast exams: yes Colonoscopy:  none BMD:   none TDaP:  2010 Shingles: no Pneumonia: no Hep C and HIV: HIV neg 2019 per patient Labs: if needed   reports that she has never smoked. She has never used smokeless tobacco. She reports that she drinks about 3.0 standard drinks of alcohol per week. She reports that she does not use drugs.  Past Medical History:  Diagnosis Date  . Anxiety   . Depression   . Graves disease    goiter  . Hypertension   . Hypothyroidism 2015  . Lyme disease 2008    Past Surgical History:  Procedure Laterality Date  . CYST EXCISION     from face 2018  . WISDOM TOOTH EXTRACTION      Current Outpatient Medications  Medication Sig Dispense Refill  . ACETAMINOPHEN PO Take by mouth as needed.    Marland Kitchen acyclovir ointment (ZOVIRAX) 5 % 1 APPLICATION TO AFFECTED AREA AS NEEDED  EXTERNALLY  0  . hydrochlorothiazide (HYDRODIURIL) 25 MG tablet     . levothyroxine (SYNTHROID, LEVOTHROID) 112 MCG tablet TAKE 1 TABLET ON AN EMPTY STOMACH IN THE MORNING ONCE A DAY ORALLY 30 DAYS  5  . metoprolol succinate (TOPROL-XL) 50 MG 24 hr tablet Take 50 mg by mouth daily.  12  . pantoprazole (PROTONIX) 40 MG tablet Take 40 mg by mouth daily.  1  . ranitidine (ZANTAC) 150 MG tablet TAKE 1-2 TABLET AT BEDTIME ONCE A DAY ORALLY 90 DAY(S)  1   No current facility-administered medications for this visit.     Family History  Problem Relation Age of Onset  . Thyroid disease Father   . ALS Father   . Depression Sister     ROS:  Pertinent items are noted in HPI.  Otherwise, a comprehensive ROS was negative.  Exam:   There were no vitals taken for this visit.   Ht Readings from Last 3 Encounters:  04/25/17 5' 3.25" (1.607 m)  01/08/17 5' 3.25" (1.607 m)  11/29/15 5' 3.25" (1.607 m)    General appearance: alert, cooperative and appears stated age Head: Normocephalic, without obvious abnormality, atraumatic Neck: no adenopathy, supple, symmetrical, trachea midline and thyroid normal to inspection and palpation Lungs: clear to auscultation bilaterally Breasts: normal appearance, no masses or tenderness, No nipple retraction or dimpling, No nipple discharge or bleeding, No axillary or supraclavicular adenopathy Heart: regular rate and rhythm Abdomen:  soft, non-tender; no masses,  no organomegaly Extremities: extremities normal, atraumatic, no cyanosis or edema Skin: Skin color, texture, turgor normal. No rashes or lesions Lymph nodes: Cervical, supraclavicular, and axillary nodes normal. No abnormal inguinal nodes palpated Neurologic: Grossly normal   Pelvic: External genitalia:  no lesions              Urethra:  normal appearing urethra with no masses, tenderness or lesions              Bartholin's and Skene's: normal                 Vagina: normal appearing vagina with  normal color and discharge, no lesions              Cervix: no cervical motion tenderness, no lesions and nulliparous appearance              Pap taken: No. Bimanual Exam:  Uterus:  normal size, contour, position, consistency, mobility, non-tender anteverted              Adnexa: normal adnexa and no mass, fullness, tenderness               Rectovaginal: Confirms               Anus:  normal appearance, no lesions  Chaperone present: yes  A:  Well Woman with normal exam  Contraception condoms, ovulation charting   History of HSV 1 needs Rx update  History of hypertension/hypothyroid with MD management.  P:   Reviewed health and wellness pertinent to exam  Discussed risks/benefits of IUD. Questions addressed.  Rx Zovirax see order with instructions   Continue follow up with MD as indicated.  Pap smear: no   counseled on breast self exam, STD prevention, HIV risk factors and prevention, family planning choices, adequate intake of calcium and vitamin D, diet and exercise, discussed baking soda sitz bath for occasional vaginal odor.  return annually or prn  An After Visit Summary was printed and given to the patient.

## 2018-01-30 ENCOUNTER — Emergency Department (HOSPITAL_COMMUNITY): Payer: BC Managed Care – PPO

## 2018-01-30 ENCOUNTER — Emergency Department (HOSPITAL_COMMUNITY)
Admission: EM | Admit: 2018-01-30 | Discharge: 2018-01-31 | Disposition: A | Discharge status: Home / Self Care | Payer: BC Managed Care – PPO | Attending: Emergency Medicine | Admitting: Emergency Medicine

## 2018-01-30 ENCOUNTER — Other Ambulatory Visit: Payer: Self-pay

## 2018-01-30 DIAGNOSIS — E039 Hypothyroidism, unspecified: Secondary | ICD-10-CM | POA: Diagnosis not present

## 2018-01-30 DIAGNOSIS — I10 Essential (primary) hypertension: Secondary | ICD-10-CM | POA: Diagnosis not present

## 2018-01-30 DIAGNOSIS — R0602 Shortness of breath: Secondary | ICD-10-CM | POA: Insufficient documentation

## 2018-01-30 DIAGNOSIS — M62830 Muscle spasm of back: Secondary | ICD-10-CM

## 2018-01-30 DIAGNOSIS — Z79899 Other long term (current) drug therapy: Secondary | ICD-10-CM | POA: Insufficient documentation

## 2018-01-30 LAB — BASIC METABOLIC PANEL
ANION GAP: 11 (ref 5–15)
BUN: 6 mg/dL (ref 6–20)
CALCIUM: 9.8 mg/dL (ref 8.9–10.3)
CO2: 25 mmol/L (ref 22–32)
CREATININE: 0.75 mg/dL (ref 0.44–1.00)
Chloride: 101 mmol/L (ref 98–111)
GFR calc Af Amer: >60 mL/min (ref >60)
GFR calc non Af Amer: >60 mL/min (ref >60)
GLUCOSE: 128 mg/dL — AB (ref 70–99)
POTASSIUM: 3.4 mmol/L — AB (ref 3.5–5.1)
Sodium: 137 mmol/L (ref 135–145)

## 2018-01-30 LAB — I-STAT TROPONIN, ED: Troponin i, poc: 0 ng/mL (ref 0.00–0.08)

## 2018-01-30 LAB — CBC
HCT: 42.5 % (ref 36.0–46.0)
Hemoglobin: 14.2 g/dL (ref 12.0–15.0)
MCH: 30.7 pg (ref 26.0–34.0)
MCHC: 33.4 g/dL (ref 30.0–36.0)
MCV: 92 fL (ref 80.0–100.0)
NRBC: 0 % (ref 0.0–0.2)
PLATELETS: 306 10*3/uL (ref 150–400)
RBC: 4.62 MIL/uL (ref 3.87–5.11)
RDW: 11.5 % (ref 11.5–15.5)
WBC: 18.2 10*3/uL — ABNORMAL HIGH (ref 4.0–10.5)

## 2018-01-30 LAB — I-STAT BETA HCG BLOOD, ED (MC, WL, AP ONLY): I-stat hCG, quantitative: <5 m[IU]/mL (ref <5)

## 2018-01-30 LAB — D-DIMER, QUANTITATIVE: D-Dimer, Quant: 1.08 ug/mL-FEU — ABNORMAL HIGH (ref 0.00–0.50)

## 2018-01-30 MED ORDER — IOPAMIDOL (ISOVUE-370) INJECTION 76%
INTRAVENOUS | Status: AC
  Filled 2018-01-30: qty 100

## 2018-01-30 MED ORDER — IOPAMIDOL (ISOVUE-370) INJECTION 76%
100.0000 mL | Freq: Once | INTRAVENOUS | Status: AC | PRN
  Administered 2018-01-30: 44 mL via INTRAVENOUS

## 2018-01-30 NOTE — ED Provider Notes (Signed)
[facility] EMERGENCY DEPARTMENT Provider Note   CSN: [id number] Arrival date & time: 01/30/18  1948     History   Chief Complaint Chief Complaint  Patient presents with  . Shortness of Breath    HPI Tanya Valencia is a 27 y.o. female.  The history is provided by the patient. No language interpreter was used.  Shortness of Breath  This is a new problem. The problem occurs continuously.The current episode started 12 to 24 hours ago. The problem has been gradually worsening. Associated symptoms include chest pain. Pertinent negatives include no fever, no cough and no rash. Risk factors include recent prolonged sitting. She has had no prior hospitalizations. Associated medical issues do not include asthma.  Pt was seen by Primary and sent to Ed for evaluation of possible PE.  Pt reports she has pain in her back.  Pt recently traveled to [place].   Past Medical History:  Diagnosis Date  . Anxiety   . Depression   . Graves disease    goiter  . Hypertension   . Hypothyroidism 2015  . Lyme disease 2008    There are no active problems to display for this patient.   Past Surgical History:  Procedure Laterality Date  . CYST EXCISION     from face 2018  . WISDOM TOOTH EXTRACTION       OB History    Gravida  0   Para  0   Term  0   Preterm  0   AB  0   Living  0     SAB  0   TAB  0   Ectopic  0   Multiple  0   Live Births               Home Medications    Prior to Admission medications   Medication Sig Start Date End Date Taking? Authorizing Provider  ACETAMINOPHEN PO Take by mouth as needed.    [provider]  acyclovir ointment (ZOVIRAX) 5 % Apply topically to affected area as needed for 3 days 01/21/18   Regina Eck, CNM  levothyroxine (SYNTHROID, LEVOTHROID) 112 MCG tablet TAKE 1 TABLET ON AN EMPTY STOMACH IN THE MORNING ONCE A DAY ORALLY 30 DAYS 11/01/15   [provider]  metoprolol succinate  (TOPROL-XL) 25 MG 24 hr tablet Take 25 mg by mouth daily. 01/09/18   [provider]  pantoprazole (PROTONIX) 40 MG tablet Take 40 mg by mouth daily. 04/15/17   [provider]    Family History Family History  Problem Relation Age of Onset  . Thyroid disease Father   . ALS Father   . Depression Sister     Social History Social History   Tobacco Use  . Smoking status: Never Smoker  . Smokeless tobacco: Never Used  Substance Use Topics  . Alcohol use: Yes    Alcohol/week: 2.0 standard drinks    Types: 2 Standard drinks or equivalent per week  . Drug use: No     Allergies   Patient has no known allergies.   Review of Systems Review of Systems  Constitutional: Negative for fever.  Respiratory: Positive for shortness of breath. Negative for cough.   Cardiovascular: Positive for chest pain.  Skin: Negative for rash.  All other systems reviewed and are negative.    Physical Exam Updated Vital Signs BP (!) 172/108 (BP Location: Right Arm)   Pulse 91   Temp 98.5 F (36.9 C) (  Oral)   Resp 18   Ht 5\' 3"  (1.6 m)   Wt 63.5 kg   LMP 01/07/2018 (Exact Date)   SpO2 100%   BMI 24.80 kg/m   Physical Exam Vitals signs and nursing note reviewed.  Constitutional:      Appearance: She is well-developed.  HENT:     Head: Normocephalic.  Neck:     Musculoskeletal: Normal range of motion.  Cardiovascular:     Rate and Rhythm: Normal rate.  Pulmonary:     Effort: Pulmonary effort is normal.     Breath sounds: No decreased breath sounds, wheezing, rhonchi or rales.  Chest:     Chest wall: Tenderness present.  Abdominal:     Palpations: Abdomen is soft.  Musculoskeletal:     Right lower leg: No edema.     Left lower leg: No edema.  Skin:    General: Skin is warm.  Neurological:     General: No focal deficit present.     Mental Status: She is alert.      ED Treatments / Results  Labs (all labs ordered are listed, but only abnormal results are  displayed) Labs Reviewed  BASIC METABOLIC PANEL - Abnormal; Notable for the following components:      Result Value   Potassium 3.4 (*)    Glucose, Bld 128 (*)    All other components within normal limits  CBC - Abnormal; Notable for the following components:   WBC 18.2 (*)    All other components within normal limits  D-DIMER, QUANTITATIVE (NOT AT [facility])  I-STAT TROPONIN, ED  I-STAT BETA HCG BLOOD, ED ([facility], [facility], [facility] ONLY)    EKG EKG Interpretation  Date/Time:  Thursday January 30 2018 19:56:08 EST Ventricular Rate:  62 PR Interval:  132 QRS Duration: 94 QT Interval:  396 QTC Calculation: 401 R Axis:   55 Text Interpretation:  Normal sinus rhythm with sinus arrhythmia Normal ECG No previous ECGs available Confirmed by Gareth Morgan [phone]) on 01/30/2018 9:38:42 PM   Radiology No results found.  Procedures Procedures (including critical care time)  Medications Ordered in ED Medications - No data to display   Initial Impression / Assessment and Plan / ED Course  I have reviewed the triage vital signs and the nursing notes.  Pertinent labs & imaging results that were available during my care of the patient were reviewed by me and considered in my medical decision making (see chart for details).     MDM  Ddimer is positive.  Pt's care turned over to Dr. Billy Fischer 11:30  Ct pending   Final Clinical Impressions(s) / ED Diagnoses   Final diagnoses:  Shortness of breath  Spasm of thoracic back muscle    ED Discharge Orders    None       Sidney Ace 01/31/18 1221    Gareth Morgan, MD 02/01/18 [phone]

## 2018-01-30 NOTE — ED Triage Notes (Signed)
Sent POV from [facility]. Pt reports SOB, back pain X 1.5 days. [place] concerned for PE.

## 2018-01-31 MED ORDER — METHOCARBAMOL 500 MG PO TABS: 500.0000 mg | ORAL_TABLET | Freq: Three times a day (TID) | ORAL | 0 refills | Status: DC | PRN

## 2018-01-31 MED ORDER — HEPARIN SOD (PORK) LOCK FLUSH 100 UNIT/ML IV SOLN: 500.0000 [IU] | Freq: Once | INTRAVENOUS | Status: DC

## 2018-01-31 NOTE — ED Provider Notes (Addendum)
12:00 AM  Assumed care from Dr. Billy Fischer.  Patient is a 27 year old who presents the emergency department shortness of breath.  Sent here to rule out PE.  D-dimer elevated.  CTA pending.  She does have a leukocytosis.  May also be pneumonia.  1:54  AM  Pt's CT scan shows no acute abnormality.  No pneumonia.  No PE.  No pulmonary edema.  She does have a leukocytosis.  Recommended close follow-up with her PCP for this.   Patient and sister have multiple questions and seem very concerned.  She states she is mostly concerned about her right upper thoracic back pain.  It was worse with movement, palpation and deep inspiration.  Given medication here and states she is feeling better.  She is moving all extremities normally and has no sensation deficit.  Able to ambulate.  Suspect muscle spasm.  She is tender over the rhomboid, trapezius muscles on the right side.  Do not think this is an epidural abscess, epidural hematoma, spinal stenosis, cauda equina, discitis or osteomyelitis, transverse myelitis.  I do not feel she needs emergent imaging of her spine.  She has no midline spinal tenderness, step-off or deformity on examination.  Will discharge with prescription of Robaxin as well and have recommended Tylenol over-the-counter.  States she is unable to take NSAIDs.  She seems very concerned that this could be pneumonia but she has no fever, no cough with a clear CT scan.  Discussed with her that her leukocytosis is likely reactive and she can follow-up with a PCP for this.  I have low suspicion for leukemia.  We have no baseline white blood cell count to compare this to.  Spent a significant of time answering questions for patient and her sister.  She seems reassured on discharge.   At this time, I do not feel there is any life-threatening condition present. I have reviewed and discussed all results (EKG, imaging, lab, urine as appropriate) and exam findings with patient/family. I have reviewed nursing notes and  appropriate previous records.  I feel the patient is safe to be discharged home without further emergent workup and can continue workup as an outpatient as needed. Discussed usual and customary return precautions. Patient/family verbalize understanding and are comfortable with this plan.  Outpatient follow-up has been provided as needed. All questions have been answered.    , Delice Bison, DO 01/31/18 0115   2:14 AM  Pt discussed with nursing staff that she is also having right-sided ear pain and is wondering if this is the cause of her leukocytosis.  I have evaluated both of her ears and she has no signs of otitis media, otitis externa.  Her TMs appear normal without bulging, effusion, erythema, purulence.  She does have small amount of wax noted to both canals but no obstruction.  No foreign body.  Posterior oropharynx also appears normal without pharyngeal erythema, tonsillar hypertrophy or exudate, uvular deviation.  She has no trismus, drooling.  No sign of dental infection on exam.  No Ludwig's.  I do not feel she needs antibiotics at this time.  Patient reassured.     EKG Interpretation  Date/Time:  Thursday January 30 2018 19:56:08 EST Ventricular Rate:  62 PR Interval:  132 QRS Duration: 94 QT Interval:  396 QTC Calculation: 401 R Axis:   55 Text Interpretation:  Normal sinus rhythm with sinus arrhythmia Normal ECG No previous ECGs available Confirmed by Gareth Morgan [phone]) on 01/30/2018 9:38:42 PM  , Delice Bison, DO 01/31/18 0154    , Delice Bison, DO 01/31/18 0175

## 2018-01-31 NOTE — ED Notes (Signed)
ED Provider at bedside. 

## 2018-01-31 NOTE — Discharge Instructions (Addendum)
Your CT scan today was normal.  There was no sign of blood clot, pneumonia, fluid on your lungs or collapsed lung.  Your blood work was also reassuring other than an elevated white blood cell count which can happen with infection but also under stressful situations.  I recommend close follow-up with your primary care physician to have this rechecked in the next 1 to 2 weeks.  You may be developing the onset of a viral illness causing some of your symptoms.  If you develop worsening shortness of breath, fever of 100.4 or higher, vomiting and cannot stop, please return to the emergency department.  You may take Tylenol 1000 mg every 6 hours as needed for pain.  Your back pain is likely caused from muscle strain, spasm.  Your spine appeared normal on your CT scan.  If you develop numbness, weakness, difficulty holding your bowel or bladder, please return to the emergency department.

## 2018-01-31 NOTE — ED Notes (Signed)
Pt stated she forgot to mention to the Dr that her right ear has been bothering her, MD notified and stated she will come to room to look at her ear. Pt verbalized understanding.

## 2018-01-31 NOTE — ED Notes (Signed)
Patient verbalizes understanding of discharge instructions. Opportunity for questioning and answers were provided. Armband removed by staff, pt discharged from ED ambulatory.   

## 2018-03-03 ENCOUNTER — Ambulatory Visit: Payer: BC Managed Care – PPO | Admitting: Clinical

## 2018-03-03 DIAGNOSIS — F419 Anxiety disorder, unspecified: Secondary | ICD-10-CM | POA: Diagnosis not present

## 2018-03-31 ENCOUNTER — Ambulatory Visit: Payer: BC Managed Care – PPO | Admitting: Clinical

## 2018-03-31 DIAGNOSIS — F419 Anxiety disorder, unspecified: Secondary | ICD-10-CM

## 2018-04-01 ENCOUNTER — Telehealth: Payer: Self-pay | Admitting: Adult Health

## 2018-04-01 NOTE — Telephone Encounter (Signed)
A new hem appt has been scheduled for the pt to see Wilber Bihari on 3/5 at 830am.

## 2018-04-24 ENCOUNTER — Encounter: Payer: Self-pay | Admitting: Adult Health

## 2018-04-28 ENCOUNTER — Ambulatory Visit: Payer: BC Managed Care – PPO | Admitting: Clinical

## 2018-04-28 DIAGNOSIS — F419 Anxiety disorder, unspecified: Secondary | ICD-10-CM

## 2018-06-12 ENCOUNTER — Ambulatory Visit: Payer: BC Managed Care – PPO | Admitting: Clinical

## 2018-06-14 ENCOUNTER — Ambulatory Visit (INDEPENDENT_AMBULATORY_CARE_PROVIDER_SITE_OTHER): Payer: BC Managed Care – PPO | Admitting: Clinical

## 2018-06-14 DIAGNOSIS — F419 Anxiety disorder, unspecified: Secondary | ICD-10-CM | POA: Diagnosis not present

## 2018-07-18 ENCOUNTER — Ambulatory Visit: Payer: BC Managed Care – PPO | Admitting: Clinical

## 2018-08-20 ENCOUNTER — Other Ambulatory Visit: Payer: Self-pay | Admitting: *Deleted

## 2018-08-20 DIAGNOSIS — Z20822 Contact with and (suspected) exposure to covid-19: Secondary | ICD-10-CM

## 2018-08-26 LAB — NOVEL CORONAVIRUS, NAA: SARS-CoV-2, NAA: NOT DETECTED

## 2018-08-26 LAB — SPECIMEN STATUS REPORT

## 2018-09-15 ENCOUNTER — Other Ambulatory Visit: Payer: Self-pay

## 2018-09-16 ENCOUNTER — Encounter: Payer: Self-pay | Admitting: Certified Nurse Midwife

## 2018-09-16 ENCOUNTER — Ambulatory Visit: Payer: BC Managed Care – PPO | Admitting: Certified Nurse Midwife

## 2018-09-16 ENCOUNTER — Other Ambulatory Visit: Payer: Self-pay

## 2018-09-16 VITALS — BP 114/70 | HR 68 | Temp 97.9°F | Resp 16 | Wt 126.0 lb

## 2018-09-16 DIAGNOSIS — Z113 Encounter for screening for infections with a predominantly sexual mode of transmission: Secondary | ICD-10-CM | POA: Diagnosis not present

## 2018-09-16 DIAGNOSIS — N898 Other specified noninflammatory disorders of vagina: Secondary | ICD-10-CM

## 2018-09-16 DIAGNOSIS — R3915 Urgency of urination: Secondary | ICD-10-CM

## 2018-09-16 DIAGNOSIS — R102 Pelvic and perineal pain: Secondary | ICD-10-CM

## 2018-09-16 LAB — POCT URINALYSIS DIPSTICK
Bilirubin, UA: NEGATIVE
Blood, UA: NEGATIVE
Glucose, UA: NEGATIVE
Ketones, UA: NEGATIVE
Leukocytes, UA: NEGATIVE
Nitrite, UA: NEGATIVE
Protein, UA: NEGATIVE
Urobilinogen, UA: NEGATIVE E.U./dL — AB
pH, UA: 5 (ref 5.0–8.0)

## 2018-09-16 NOTE — Progress Notes (Signed)
28 y.o. Single Caucasian female G0P0000 here with complaint of vaginal symptoms of vaginal odor and some pain with sexual activity, no partner size issue. Using condoms consistent.Onset of symptoms 3  days ago. Denies new personal products, vaginal bleeding, blisters or rash. New partner, but partner denies any symptoms or STD history.  Urinary symptoms:  slight urgency only,no pain with urination or odor. Patient was traveling 2 days ago and held urine for long period of time and felt this was the cause of urgency. Denies frequency, blood in urine, or back pain. She also is ovulating and  has noted this with ovulation also.. Contraception is Consistent condoms, no problems with use. Desires STD screening. No other health issues today.  Review of Systems  Genitourinary:       Pelvic pain, std testing, vaginal odor   poct urine-neg O:Healthy female WDWN Affect: normal, orientation x 3  Exam:Skin: warm and dry CVAT: bilateral negative Abdomen:soft, non tender, no rebound, slight positive suprapubic only.  Inguinal Lymph nodes: no enlargement or tenderness Pelvic exam: External genital: normal female BUS: negative Vagina: beige creamy odorous  discharge noted. Affirm taken, GC/chlamydia taken Cervix: normal, non tender, no CMT, no purulent discharge noted Uterus: normal,anteverted, non tender to palpation Adnexa:normal, non tender, no masses or fullness noted   A:Normal pelvic exam Urine negative R/O STD's and vaginal infection New partner Contraception consistent condom use, patient happy with choice at this time not interested in change, declines discussion of options.   P:Discussed findings of normal pelvic exam and no evidence of UTI. Discussed Aveeno or baking soda sitz bath for comfort and vaginal discharge odor control until lab back. Discussed importance of not holding urine for long periods of time which can increase risk of UTI. Increase water intake today. Warning signs of UTI  and pelvic pain given and if change needs to advise. Discussed importance of STD prevention and condoms are a good choice. Discussed encouraging partner to also have STD screening for trust issues and her piece of mind. Questions addressed at length.  Labs: HIV,RPR, Hep. C, Affirm, GC,Chlamydia  Rv prn

## 2018-09-17 ENCOUNTER — Other Ambulatory Visit: Payer: Self-pay

## 2018-09-17 LAB — VAGINITIS/VAGINOSIS, DNA PROBE
Candida Species: NEGATIVE
Gardnerella vaginalis: POSITIVE — AB
Trichomonas vaginosis: NEGATIVE

## 2018-09-17 LAB — HIV ANTIBODY (ROUTINE TESTING W REFLEX): HIV Screen 4th Generation wRfx: NONREACTIVE

## 2018-09-17 LAB — SYPHILIS: RPR W/REFLEX TO RPR TITER AND TREPONEMAL ANTIBODIES, TRADITIONAL SCREENING AND DIAGNOSIS ALGORITHM: RPR Ser Ql: NONREACTIVE

## 2018-09-17 LAB — HEPATITIS C ANTIBODY: Hep C Virus Ab: <0.1 s/co ratio (ref 0.0–0.9)

## 2018-09-17 MED ORDER — METRONIDAZOLE 500 MG PO TABS: 500.0000 mg | ORAL_TABLET | Freq: Two times a day (BID) | ORAL | 0 refills | Status: DC

## 2018-09-18 LAB — GC/CHLAMYDIA PROBE AMP
Chlamydia trachomatis, NAA: NEGATIVE
Neisseria Gonorrhoeae by PCR: NEGATIVE

## 2018-12-07 IMAGING — DX DG CHEST 2V
2 series · 2 of 2 positions shown · non-contrast
Comparison: 02/06/2017

CLINICAL DATA: Shortness of breath with left lower chest pain and
left upper quadrant abdominal pain over the last 6 months.

EXAM:
CHEST - 2 VIEW

[dg chest 2 view (1 of 2)]
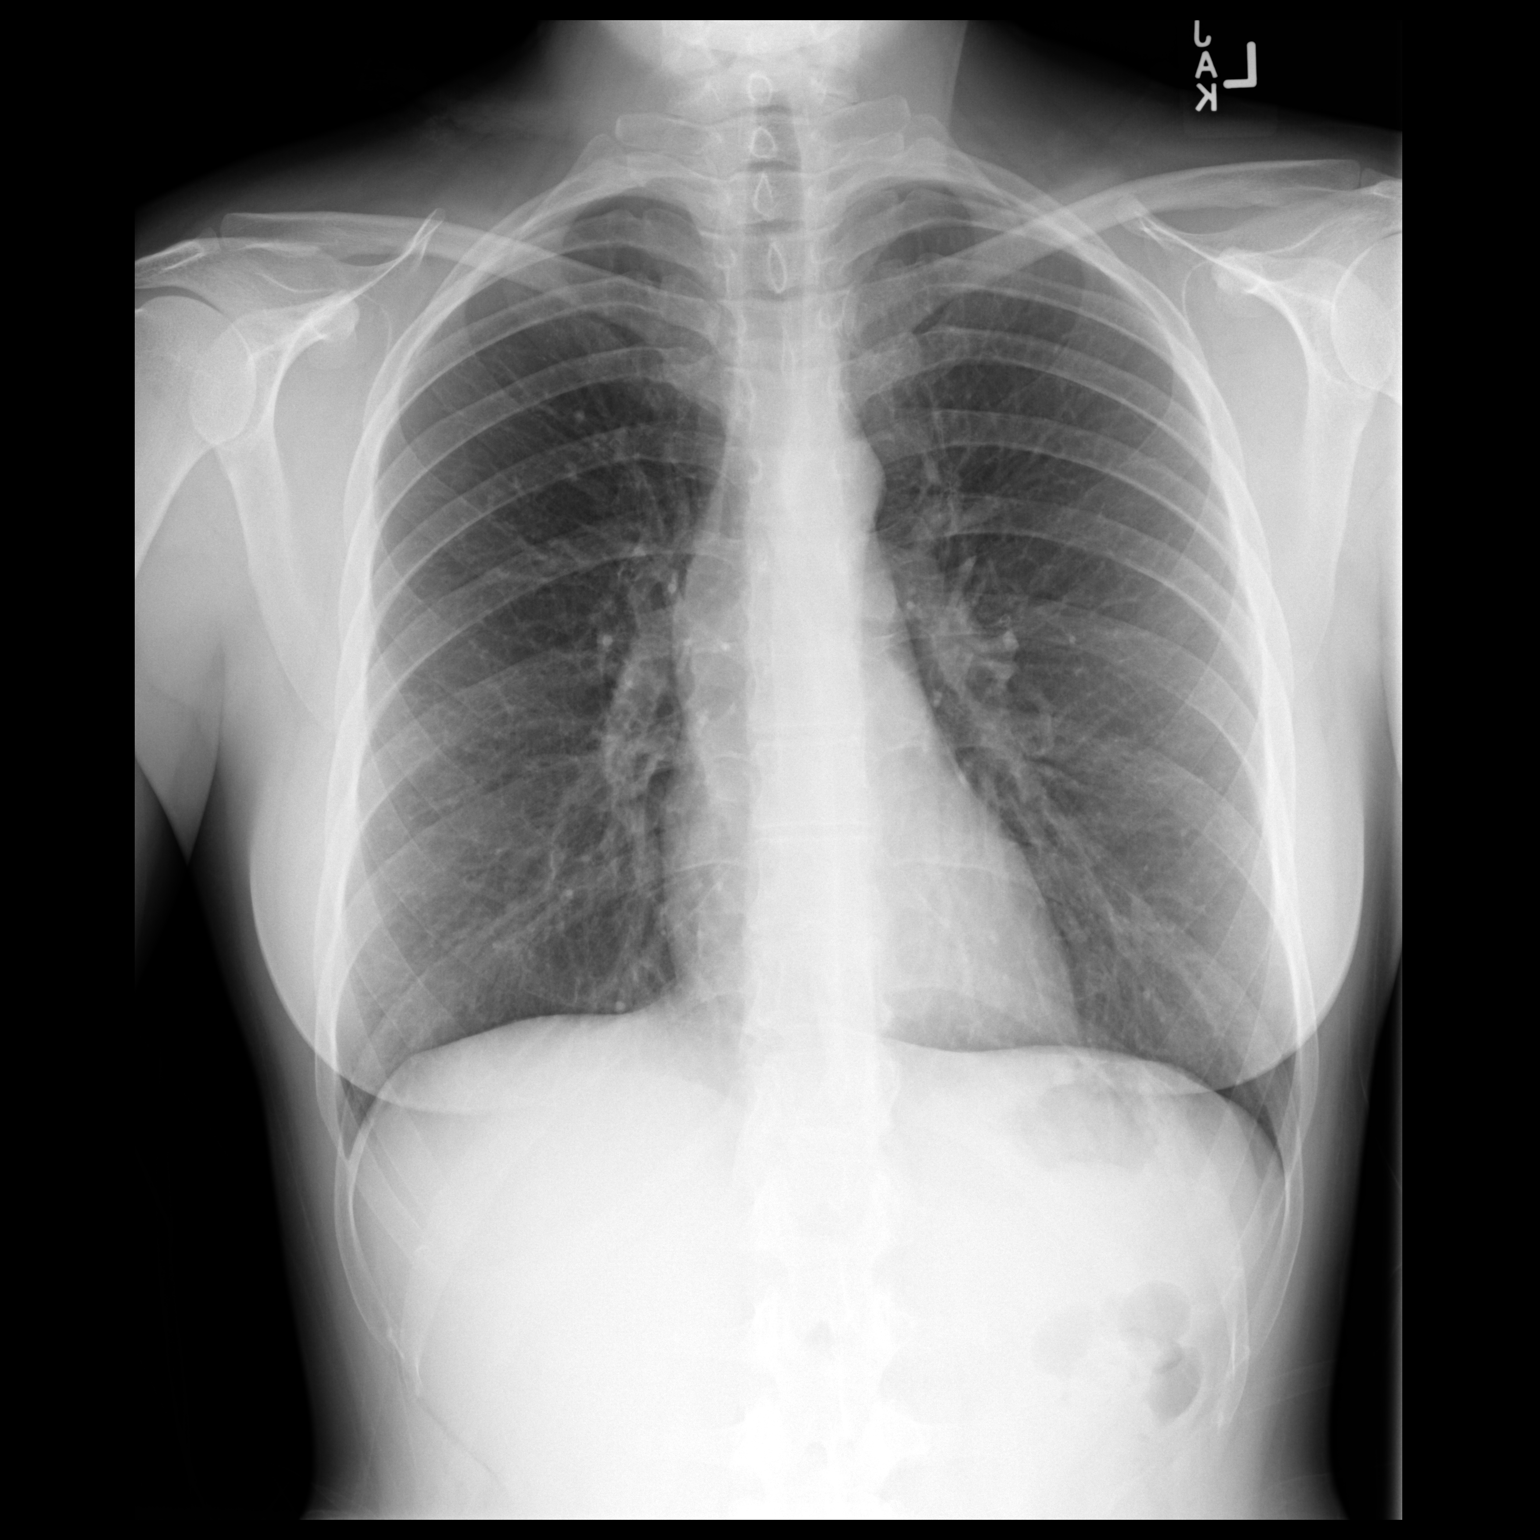

[dg chest 2 view (2 of 2)]
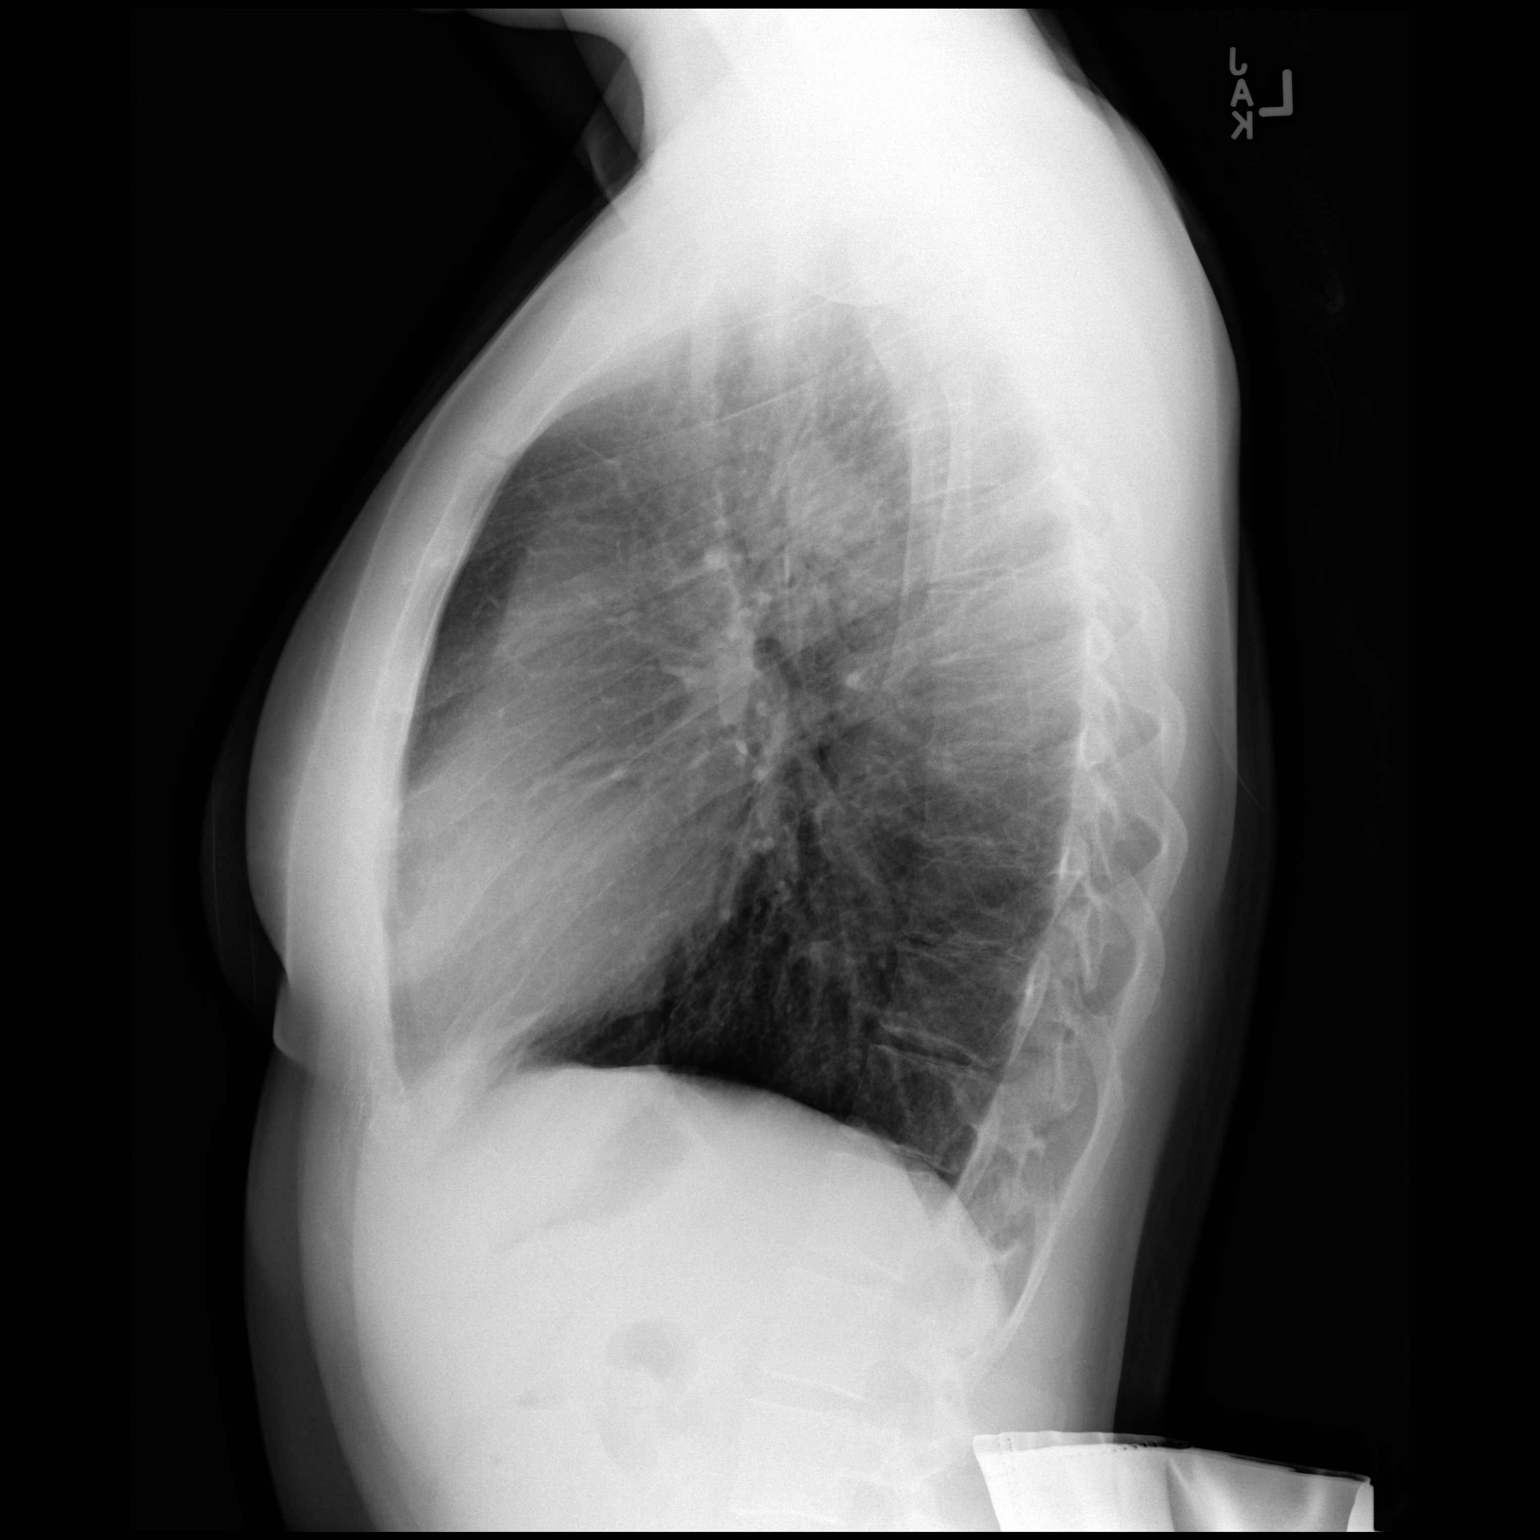

[2 of 2 positions shown; findings below may reference images not displayed]

FINDINGS: Heart size is normal. Mediastinal shadows are normal. The lungs are
clear. No bronchial thickening. No infiltrate, mass, effusion or
collapse. Pulmonary vascularity is normal. No bony abnormality.
IMPRESSION: Normal chest

## 2018-12-14 IMAGING — CT CT ABDOMEN WO/W CM
3 of 12 series · 12 of 46 positions shown, 18 images · IV contrast (APPLIED)
Comparison: CT scan 04/24/2016

CLINICAL DATA: Left-sided upper abdominal pain for 3 weeks.
Elevated testosterone level and hirsutism. Elevated catecholamine
levels.

EXAM:
CT ABDOMEN WITHOUT AND WITH CONTRAST
TECHNIQUE: Multidetector CT imaging of the abdomen was performed following the
standard protocol before and following the bolus administration of
intravenous contrast.
CONTRAST:  100mL [id number]-077 IOPAMIDOL (LA3Z2B-077) INJECTION 61%

[Series 2: axial st · axial · 0.61mm/px · z∈[-308,-134]mm · 6 of 82 slices shown, 11 images]
[im 12/82  soft-tissue]
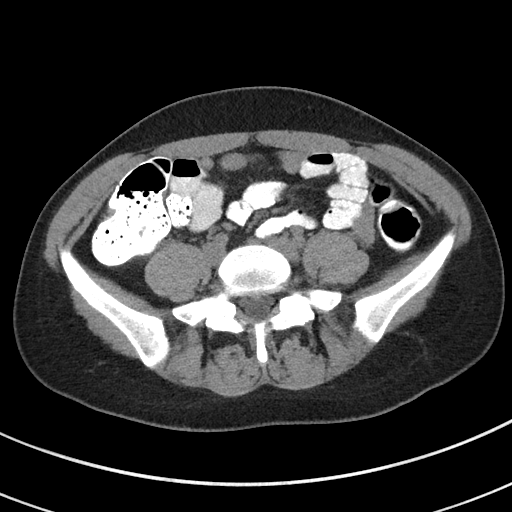
[im 12/82  bone]
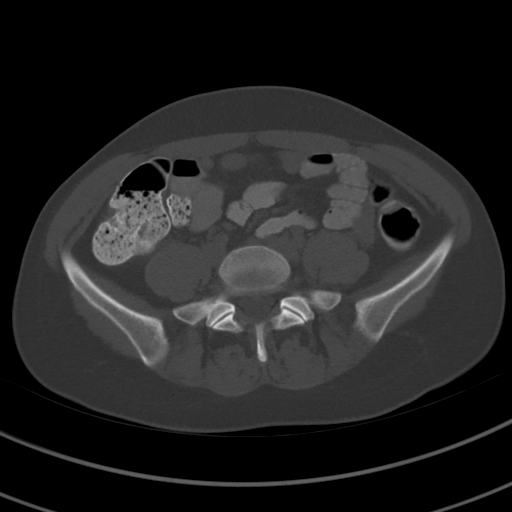
[im 24/82  soft-tissue]
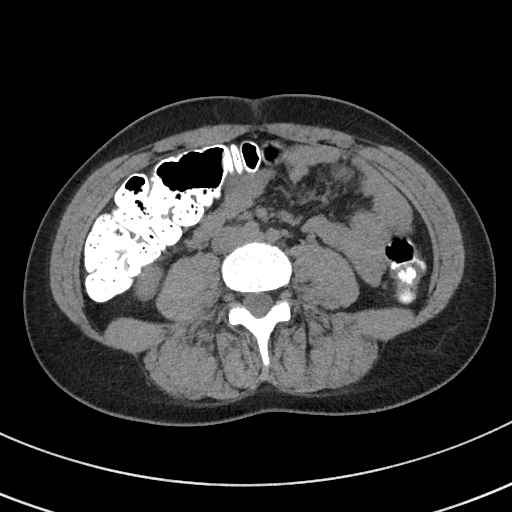
[im 35/82  soft-tissue]
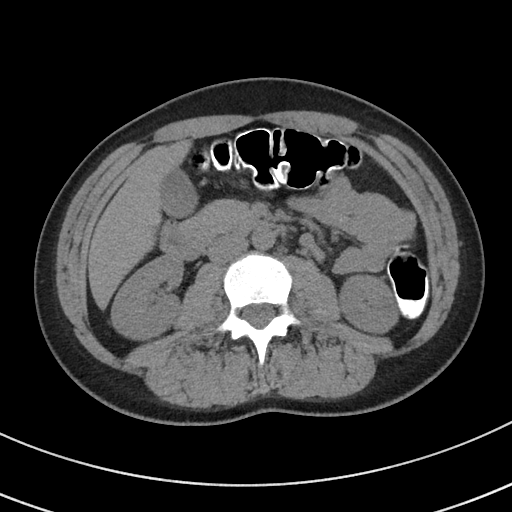
[im 35/82  lung]
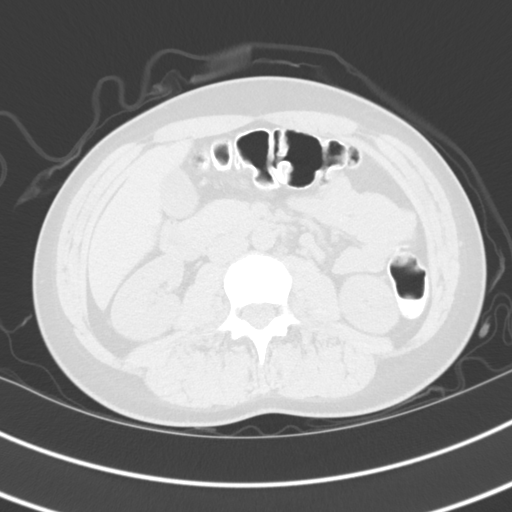
[im 47/82  soft-tissue]
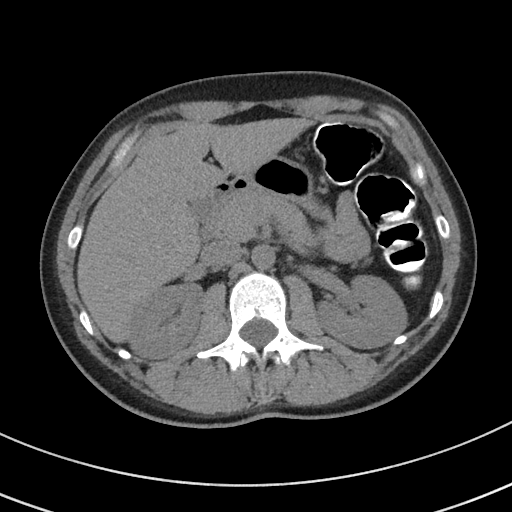
[im 47/82  lung]
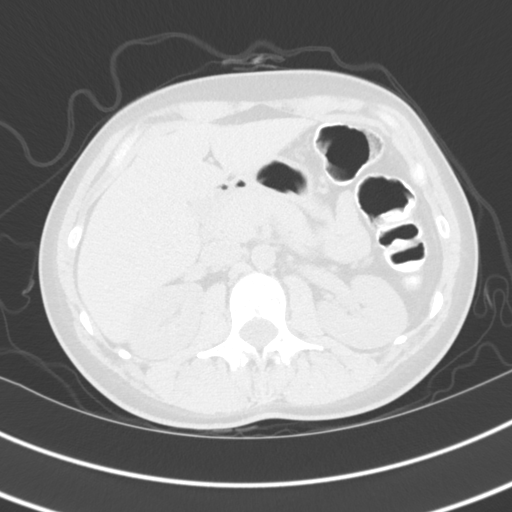
[im 58/82  soft-tissue]
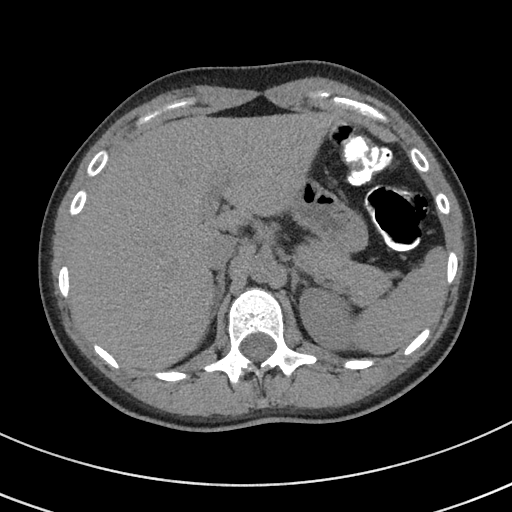
[im 58/82  lung]
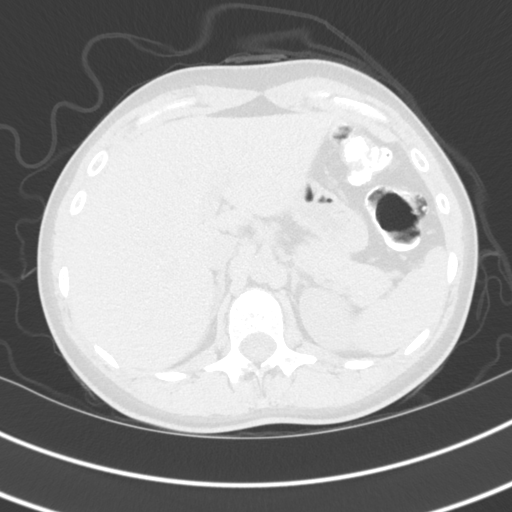
[im 70/82  soft-tissue]
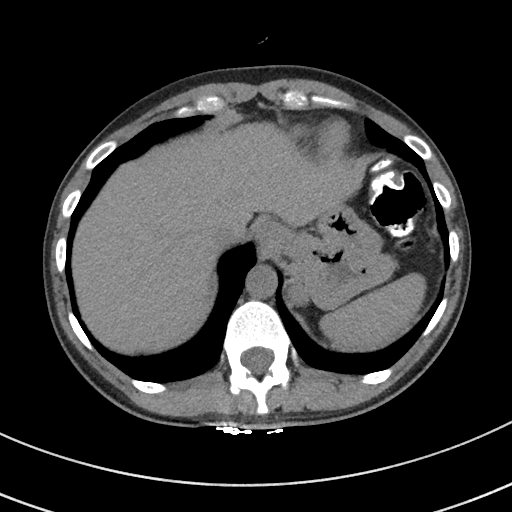
[im 70/82  lung]
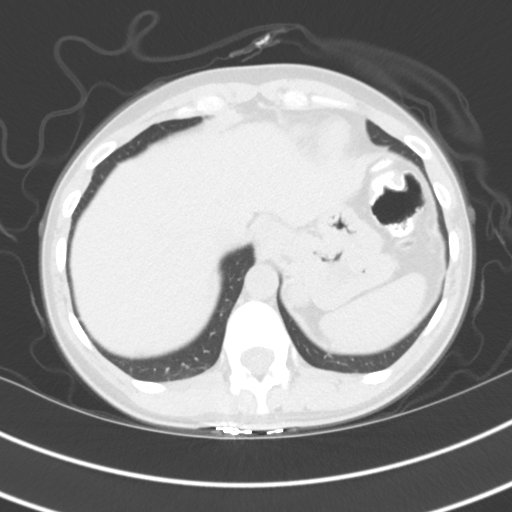

[Series 4: coronal wo · coronal · 0.48mm/px · 2 of 122 slices shown, 3 images]
[im 41/122  soft-tissue]
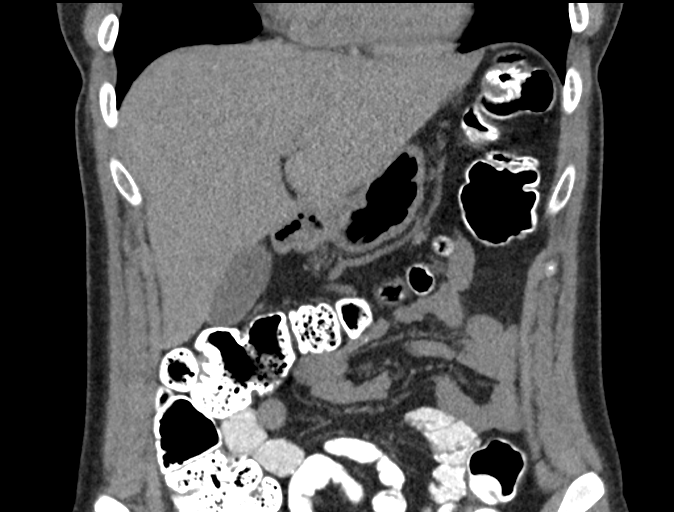
[im 41/122  bone]
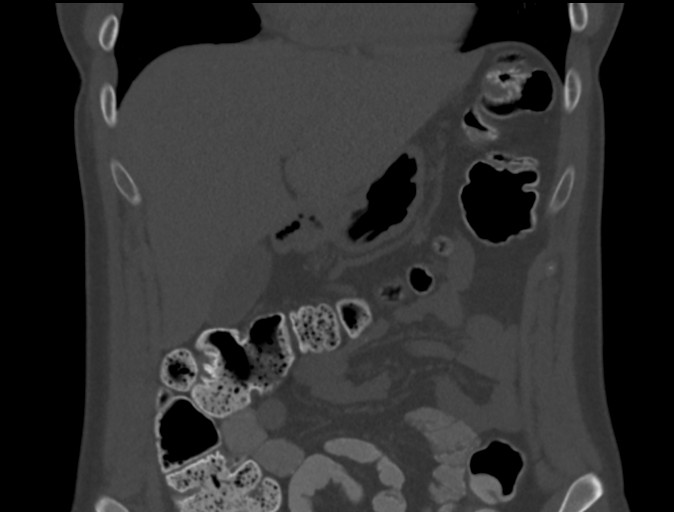
[im 81/122  soft-tissue]
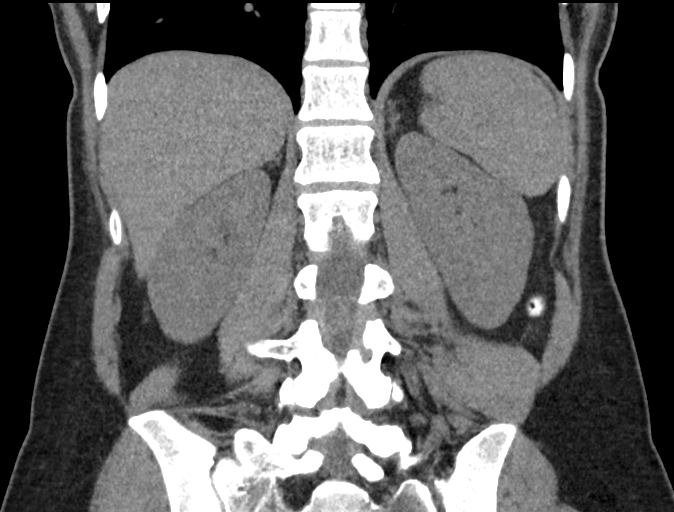

[Series 7: axial st 60 sec · axial · 0.61mm/px · z∈[-302,-180]mm · 4 of 82 slices shown]
[im 14/82  soft-tissue]
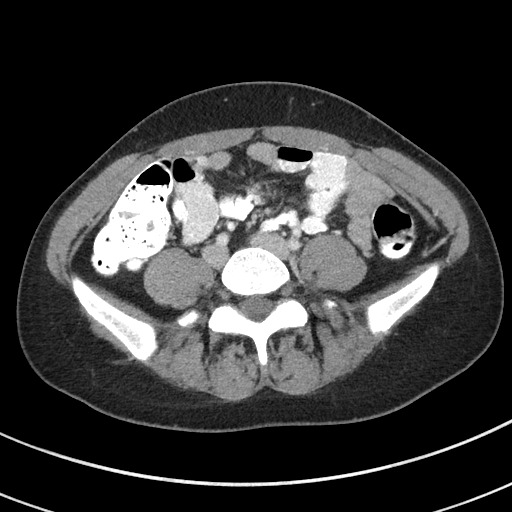
[im 28/82  soft-tissue]
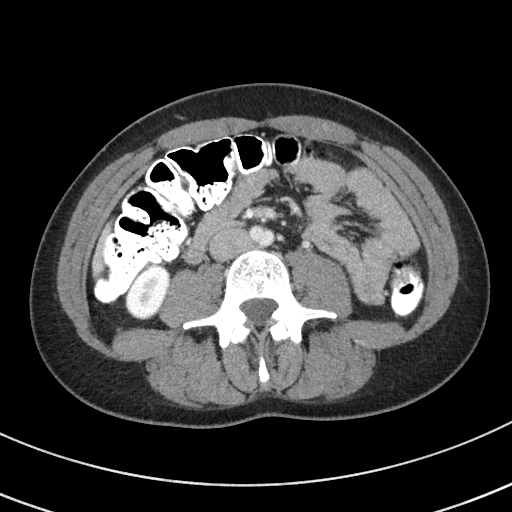
[im 41/82  soft-tissue]
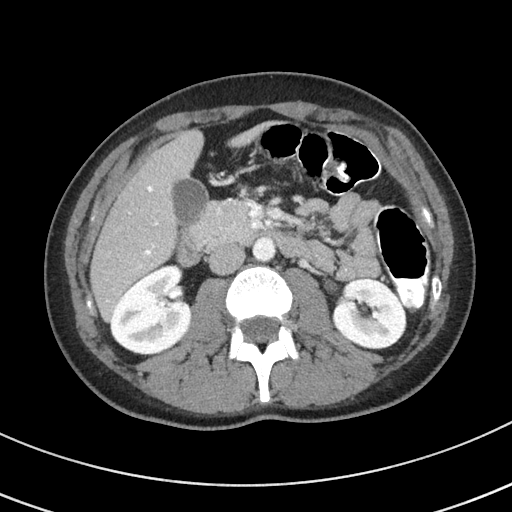
[im 55/82  soft-tissue]
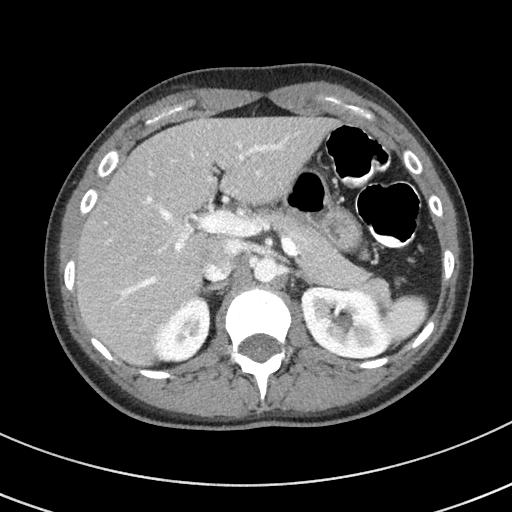

[12 of 46 positions shown; findings below may reference images not displayed]

FINDINGS: Lower chest: The lung bases are clear of acute process. No pleural
effusion or pulmonary lesions. The heart is normal in size. No
pericardial effusion. The distal esophagus and aorta are
unremarkable.

Hepatobiliary: No focal hepatic lesions or intrahepatic biliary
dilatation. The gallbladder is normal. No common bile duct
dilatation.

Pancreas: No mass, inflammation or ductal dilatation.

Spleen: Normal size.  No focal lesions.

Adrenals/Urinary Tract: The adrenal glands are normal. No adrenal
gland lesions/masses identified. No adrenal or extra adrenal
pheochromocytoma is identified in the abdomen.

Both kidneys are normal.

Stomach/Bowel: The stomach, duodenum, visualized small bowel and
visualized colon are unremarkable. A diverticulum of the gastric
cardia noted.

Vascular/Lymphatic: The aorta and branch vessels are normal. The
major venous structures are patent. No mesenteric or retroperitoneal
mass or lymphadenopathy.

Other: No ascites or abdominal wall hernia.

Musculoskeletal: The bony structures are unremarkable.
IMPRESSION: Unremarkable CT examination the abdomen. No findings for adrenal or
extra adrenal pheochromocytoma in the abdomen.

No acute abdominal findings.

## 2019-01-23 ENCOUNTER — Other Ambulatory Visit: Payer: Self-pay

## 2019-01-27 ENCOUNTER — Encounter: Payer: Self-pay | Admitting: Certified Nurse Midwife

## 2019-01-27 ENCOUNTER — Other Ambulatory Visit: Payer: Self-pay

## 2019-01-27 ENCOUNTER — Ambulatory Visit (INDEPENDENT_AMBULATORY_CARE_PROVIDER_SITE_OTHER): Payer: BC Managed Care – PPO | Admitting: Certified Nurse Midwife

## 2019-01-27 VITALS — BP 120/70 | HR 68 | Temp 97.1°F | Resp 16 | Ht 63.5 in | Wt 120.0 lb

## 2019-01-27 DIAGNOSIS — Z01419 Encounter for gynecological examination (general) (routine) without abnormal findings: Secondary | ICD-10-CM | POA: Diagnosis not present

## 2019-01-27 DIAGNOSIS — Z8639 Personal history of other endocrine, nutritional and metabolic disease: Secondary | ICD-10-CM

## 2019-01-27 DIAGNOSIS — Z23 Encounter for immunization: Secondary | ICD-10-CM | POA: Diagnosis not present
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
## 2019-01-27 NOTE — Patient Instructions (Signed)
General topics  Next pap or exam is  due in 1 year Take a Women's multivitamin Take 1200 mg. of calcium daily - prefer dietary If any concerns in interim to call back  Breast Self-Awareness Practicing breast self-awareness may pick up problems early, prevent significant medical complications, and possibly save your life. By practicing breast self-awareness, you can become familiar with how your breasts look and feel and if your breasts are changing. This allows you to notice changes early. It can also offer you some reassurance that your breast health is good. One way to learn what is normal for your breasts and whether your breasts are changing is to do a breast self-exam. If you find a lump or something that was not present in the past, it is best to contact your caregiver right away. Other findings that should be evaluated by your caregiver include nipple discharge, especially if it is bloody; skin changes or reddening; areas where the skin seems to be pulled in (retracted); or new lumps and bumps. Breast pain is seldom associated with cancer (malignancy), but should also be evaluated by a caregiver. BREAST SELF-EXAM The best time to examine your breasts is 5 7 days after your menstrual period is over.  ExitCare Patient Information 2013 [place].   Exercise to Stay Healthy Exercise helps you become and stay healthy. EXERCISE IDEAS AND TIPS Choose exercises that:  You enjoy.  Fit into your day. You do not need to exercise really hard to be healthy. You can do exercises at a slow or medium level and stay healthy. You can:  Stretch before and after working out.  Try yoga, Pilates, or tai chi.  Lift weights.  Walk fast, swim, jog, run, climb stairs, bicycle, dance, or rollerskate.  Take aerobic classes. Exercises that burn about 150 calories:  Running 1  miles in 15 minutes.  Playing volleyball for 45 to 60 minutes.  Washing and waxing a car for 45 to 60  minutes.  Playing touch football for 45 minutes.  Walking 1  miles in 35 minutes.  Pushing a stroller 1  miles in 30 minutes.  Playing basketball for 30 minutes.  Raking leaves for 30 minutes.  Bicycling 5 miles in 30 minutes.  Walking 2 miles in 30 minutes.  Dancing for 30 minutes.  Shoveling snow for 15 minutes.  Swimming laps for 20 minutes.  Walking up stairs for 15 minutes.  Bicycling 4 miles in 15 minutes.  Gardening for 30 to 45 minutes.  Jumping rope for 15 minutes.  Washing windows or floors for 45 to 60 minutes. Document Released: 03/10/2010 Document Revised: 04/30/2011 Document Reviewed: 03/10/2010 [facility] Patient Information 2013 [place].   Other topics ( that may be useful information):    Sexually Transmitted Disease Sexually transmitted disease (STD) refers to any infection that is passed from person to person during sexual activity. This may happen by way of saliva, semen, blood, vaginal mucus, or urine. Common STDs include:  Gonorrhea.  Chlamydia.  Syphilis.  HIV/AIDS.  Genital herpes.  Hepatitis B and C.  Trichomonas.  Human papillomavirus (HPV).  Pubic lice. CAUSES  An STD may be spread by bacteria, virus, or parasite. A person can get an STD by:  Sexual intercourse with an infected person.  Sharing sex toys with an infected person.  Sharing needles with an infected person.  Having intimate contact with the genitals, mouth, or rectal areas of an infected person. SYMPTOMS  Some people may  they can still pass the infection to others. Different STDs have different symptoms. Symptoms include: °· Painful or bloody urination. °· Pain in the pelvis, abdomen, vagina, anus, throat, or eyes. °· Skin rash, itching, irritation, growths, or sores (lesions). These usually occur in the genital or anal area. °· Abnormal vaginal discharge. °· Penile discharge in men. °· Soft, flesh-colored skin growths in the  genital or anal area. °· Fever. °· Pain or bleeding during sexual intercourse. °· Swollen glands in the groin area. °· Yellow skin and eyes (jaundice). This is seen with hepatitis. °DIAGNOSIS  °To make a diagnosis, your caregiver may: °· Take a medical history. °· Perform a physical exam. °· Take a specimen (culture) to be examined. °· Examine a sample of discharge under a microscope. °· Perform blood test °TREATMENT  °· Chlamydia, gonorrhea, trichomonas, and syphilis can be cured with antibiotic medicine. °· Genital herpes, hepatitis, and HIV can be treated, but not cured, with prescribed medicines. The medicines will lessen the symptoms. °· Genital warts from HPV can be treated with medicine or by freezing, burning (electrocautery), or surgery. Warts may come back. °· HPV is a virus and cannot be cured with medicine or surgery. However, abnormal areas may be followed very closely by your caregiver and may be removed from the cervix, vagina, or vulva through office procedures or surgery. °If your diagnosis is confirmed, your recent sexual partners need treatment. This is true even if they are symptom-free or have a negative culture or evaluation. They should not have sex until their caregiver says it is okay. °HOME CARE INSTRUCTIONS °· All sexual partners should be informed, tested, and treated for all STDs. °· Take your antibiotics as directed. Finish them even if you start to feel better. °· Only take over-the-counter or prescription medicines for pain, discomfort, or fever as directed by your caregiver. °· Rest. °· Eat a balanced diet and drink enough fluids to keep your urine clear or pale yellow. °· Do not have sex until treatment is completed and you have followed up with your caregiver. STDs should be checked after treatment. °· Keep all follow-up appointments, Pap tests, and blood tests as directed by your caregiver. °· Only use latex condoms and water-soluble lubricants during sexual activity. Do not use  petroleum jelly or oils. °· Avoid alcohol and illegal drugs. °· Get vaccinated for HPV and hepatitis. If you have not received these vaccines in the past, talk to your caregiver about whether one or both might be right for you. °· Avoid risky sex practices that can break the skin. °The only way to avoid getting an STD is to avoid all sexual activity. Latex condoms and dental dams (for oral sex) will help lessen the risk of getting an STD, but will not completely eliminate the risk. °SEEK MEDICAL CARE IF:  °· You have a fever. °· You have any new or worsening symptoms. °Document Released: 04/28/2002 Document Revised: 04/30/2011 Document Reviewed: 05/05/2010 °ExitCare® Patient Information ©2013 [employer]. ° ° ° °Domestic Abuse °You are being battered or abused if someone close to you hits, pushes, or physically hurts you in any way. You also are being abused if you are forced into activities. You are being sexually abused if you are forced to have sexual contact of any kind. You are being emotionally abused if you are made to feel worthless or if you are constantly threatened. It is important to remember that help is available. No one has the right to abuse you. °PREVENTION OF FURTHER   abuse you. PREVENTION OF FURTHER ABUSE  Learn the warning signs of danger. This varies with situations but may include: the use of alcohol, threats, isolation from friends and family, or forced sexual contact. Leave if you feel that violence is going to occur.  If you are attacked or beaten, report it to the police so the abuse is documented. You do not have to press charges. The police can protect you while you or the attackers are leaving. Get the officer's name and badge number and a copy of the report.  Find someone you can trust and tell them what is happening to you: your caregiver, a nurse, clergy member, close friend or family member. Feeling ashamed is natural, but remember that you have done nothing wrong. No one deserves abuse. Document Released:  02/03/2000 Document Revised: 04/30/2011 Document Reviewed: 04/13/2010 [facility] Patient Information 2013 [place].    How Much is Too Much Alcohol? Drinking too much alcohol can cause injury, accidents, and health problems. These types of problems can include:   Car crashes.  Falls.  Family fighting (domestic violence).  Drowning.  Fights.  Injuries.  Burns.  Damage to certain organs.  Having a baby with birth defects. ONE DRINK CAN BE TOO MUCH WHEN YOU ARE:  Working.  Pregnant or breastfeeding.  Taking medicines. Ask your doctor.  Driving or planning to drive. If you or someone you know has a drinking problem, get help from a doctor.  Document Released: 12/02/2008 Document Revised: 04/30/2011 Document Reviewed: 12/02/2008 [facility] Patient Information 2013 [place].   Smoking Hazards Smoking cigarettes is extremely bad for your health. Tobacco smoke has over 200 known poisons in it. There are over 60 chemicals in tobacco smoke that cause cancer. Some of the chemicals found in cigarette smoke include:   Cyanide.  Benzene.  Formaldehyde.  Methanol (wood alcohol).  Acetylene (fuel used in welding torches).  Ammonia. Cigarette smoke also contains the poisonous gases nitrogen oxide and carbon monoxide.  Cigarette smokers have an increased risk of many serious medical problems and Smoking causes approximately:  90% of all lung cancer deaths in men.  80% of all lung cancer deaths in women.  90% of deaths from chronic obstructive lung disease. Compared with nonsmokers, smoking increases the risk of:  Coronary heart disease by 2 to 4 times.  Stroke by 2 to 4 times.  Men developing lung cancer by 23 times.  Women developing lung cancer by 13 times.  Dying from chronic obstructive lung diseases by 12 times.  . Smoking is the most preventable cause of death and disease in our society.  WHY IS SMOKING ADDICTIVE?  Nicotine is the chemical  agent in tobacco that is capable of causing addiction or dependence.  When you smoke and inhale, nicotine is absorbed rapidly into the bloodstream through your lungs. Nicotine absorbed through the lungs is capable of creating a powerful addiction. Both inhaled and non-inhaled nicotine may be addictive.  Addiction studies of cigarettes and spit tobacco show that addiction to nicotine occurs mainly during the teen years, when young people begin using tobacco products. WHAT ARE THE BENEFITS OF QUITTING?  There are many health benefits to quitting smoking.   Likelihood of developing cancer and heart disease decreases. Health improvements are seen almost immediately.  Blood pressure, pulse rate, and breathing patterns start returning to normal soon after quitting. QUITTING SMOKING   American Lung Association - 1-800-LUNGUSA  American Cancer Society - 1-800-ACS-2345 Document Released: 03/15/2004 Document Revised: 04/30/2011 Document Reviewed: 11/17/2008  ExitCare Patient Information 2013 [place].   Stress Management Stress is a state of physical or mental tension that often results from changes in your life or normal routine. Some common causes of stress are:  Death of a loved one.  Injuries or severe illnesses.  Getting fired or changing jobs.  Moving into a new home. Other causes may be:  Sexual problems.  Business or financial losses.  Taking on a large debt.  Regular conflict with someone at home or at work.  Constant tiredness from lack of sleep. It is not just bad things that are stressful. It may be stressful to:  Win the lottery.  Get married.  Buy a new car. The amount of stress that can be easily tolerated varies from person to person. Changes generally cause stress, regardless of the types of change. Too much stress can affect your health. It may lead to physical or emotional problems. Too little stress (boredom) may also become stressful. SUGGESTIONS TO  REDUCE STRESS:  Talk things over with your family and friends. It often is helpful to share your concerns and worries. If you feel your problem is serious, you may want to get help from a professional counselor.  Consider your problems one at a time instead of lumping them all together. Trying to take care of everything at once may seem impossible. List all the things you need to do and then start with the most important one. Set a goal to accomplish 2 or 3 things each day. If you expect to do too many in a single day you will naturally fail, causing you to feel even more stressed.  Do not use alcohol or drugs to relieve stress. Although you may feel better for a short time, they do not remove the problems that caused the stress. They can also be habit forming.  Exercise regularly - at least 3 times per week. Physical exercise can help to relieve that "uptight" feeling and will relax you.  The shortest distance between despair and hope is often a good night's sleep.  Go to bed and get up on time allowing yourself time for appointments without being rushed.  Take a short "time-out" period from any stressful situation that occurs during the day. Close your eyes and take some deep breaths. Starting with the muscles in your face, tense them, hold it for a few seconds, then relax. Repeat this with the muscles in your neck, shoulders, hand, stomach, back and legs.  Take good care of yourself. Eat a balanced diet and get plenty of rest.  Schedule time for having fun. Take a break from your daily routine to relax. HOME CARE INSTRUCTIONS   Call if you feel overwhelmed by your problems and feel you can no longer manage them on your own.  Return immediately if you feel like hurting yourself or someone else. Document Released: 08/01/2000 Document Revised: 04/30/2011 Document Reviewed: 03/24/2007 [facility] Patient Information 2013 [place].

## 2019-05-04 ENCOUNTER — Encounter: Payer: Self-pay | Admitting: Certified Nurse Midwife

## 2019-05-06 ENCOUNTER — Encounter: Payer: Self-pay | Admitting: Certified Nurse Midwife

## 2019-05-12 ENCOUNTER — Telehealth: Payer: Self-pay | Admitting: Physician Assistant

## 2019-05-12 NOTE — Telephone Encounter (Signed)
Received a new hem referral from Dr. Buddy Duty for elevated ebc. Pt has been cld and scheduled to see Cassie on 4/5 at 1:30pm. Pt aware to arrive 15 minutes early.

## 2019-05-25 ENCOUNTER — Encounter: Payer: Self-pay | Admitting: Physician Assistant

## 2019-05-25 ENCOUNTER — Inpatient Hospital Stay: Payer: BC Managed Care – PPO

## 2019-05-25 ENCOUNTER — Inpatient Hospital Stay: Payer: BC Managed Care – PPO | Attending: Physician Assistant | Admitting: Physician Assistant

## 2019-05-25 ENCOUNTER — Other Ambulatory Visit: Payer: Self-pay

## 2019-05-25 ENCOUNTER — Other Ambulatory Visit: Payer: Self-pay | Admitting: Physician Assistant

## 2019-05-25 DIAGNOSIS — I10 Essential (primary) hypertension: Secondary | ICD-10-CM | POA: Insufficient documentation

## 2019-05-25 DIAGNOSIS — E890 Postprocedural hypothyroidism: Secondary | ICD-10-CM | POA: Diagnosis not present

## 2019-05-25 DIAGNOSIS — D72829 Elevated white blood cell count, unspecified: Secondary | ICD-10-CM | POA: Diagnosis present

## 2019-05-25 DIAGNOSIS — R634 Abnormal weight loss: Secondary | ICD-10-CM | POA: Diagnosis not present

## 2019-05-25 DIAGNOSIS — K219 Gastro-esophageal reflux disease without esophagitis: Secondary | ICD-10-CM | POA: Diagnosis not present

## 2019-05-25 DIAGNOSIS — R451 Restlessness and agitation: Secondary | ICD-10-CM | POA: Insufficient documentation

## 2019-05-25 LAB — CBC WITH DIFFERENTIAL (CANCER CENTER ONLY)
Abs Immature Granulocytes: 0.01 10*3/uL (ref 0.00–0.07)
Basophils Absolute: 0.1 10*3/uL (ref 0.0–0.1)
Basophils Relative: 1 %
Eosinophils Absolute: 0.3 10*3/uL (ref 0.0–0.5)
Eosinophils Relative: 3 %
HCT: 40.9 % (ref 36.0–46.0)
Hemoglobin: 13.7 g/dL (ref 12.0–15.0)
Immature Granulocytes: 0 %
Lymphocytes Relative: 43 %
Lymphs Abs: 3.7 10*3/uL (ref 0.7–4.0)
MCH: 32.1 pg (ref 26.0–34.0)
MCHC: 33.5 g/dL (ref 30.0–36.0)
MCV: 95.8 fL (ref 80.0–100.0)
Monocytes Absolute: 0.5 10*3/uL (ref 0.1–1.0)
Monocytes Relative: 6 %
Neutro Abs: 4.1 10*3/uL (ref 1.7–7.7)
Neutrophils Relative %: 47 %
Platelet Count: 333 10*3/uL (ref 150–400)
RBC: 4.27 MIL/uL (ref 3.87–5.11)
RDW: 12.3 % (ref 11.5–15.5)
WBC Count: 8.6 10*3/uL (ref 4.0–10.5)
nRBC: 0 % (ref 0.0–0.2)

## 2019-05-25 LAB — LACTATE DEHYDROGENASE: LDH: 140 U/L (ref 98–192)

## 2019-05-25 NOTE — Progress Notes (Signed)
[facility] [phone]   [phone]  CONSULT NOTE  REFERRING PHYSICIAN: Dr. Buddy Duty  REASON FOR CONSULTATION:  Leukocytosis   HPI Tanya Valencia is a 29 y.o. female with a past medical history significant for Graves' disease, post ablative hypothyroidism, GERD, hyperandrogenism, anxiety, and hypertension is referred to clinic for evaluation of leukocytosis.  Leukocytosis was first noted on routine labs performed last year in which her white blood cell count was elevated at 18.2.  The patient states that she was fighting off an infection at that time.  To her knowledge, she has not had a repeat CBC in the interval until her CBC was recently drawn on 05/07/2019 by her endocrinologist.  At that time her white blood cell count was slightly elevated at 11.7, neutrophils elevated at 7.8, lymphocytes elevated at 3.2.  Her ESR was normal. She saw her endocrinologist due to a 20 pound weight loss in the last 6 months or so. She attributes this to cooking more at home as opposed to eating out due to the COVID-19 pandemic. The patient follows with her endocrinologist due to her history of Graves' disease and post ablative hypothyroidism.  She is currently on Synthroid and has been on the same dose for several years. She also mentions that she received her COVID-19 vaccine about a week prior to her blood tests. At some point in time it was noted that she had elevated cortisol and androgens.  She also has had hypertension for which she used to be prescribed propranolol (was discontinued last year).  She had an extensive work-up performed including CT angiograms of the chest, CT scan of the neck, CT of the abdomen and pelvis, ultrasound of the pelvis, and ultrasound of the renal arteries which were all unremarkable. No evidence of RAS, pheochromocytoma, PCOS, etc. About a year ago she started noting intermittent left upper quadrant pain and was referred to GI for consideration of  endoscopy.  She did not have her endoscopy performed.  Today, she is feeling fairly well except she often feels the physical/chemical symptoms of stress as opposed to the mental/emotional symptoms of stress or life stressors.  She denies any signs or symptoms of infection including fevers, nasal congestion, sore throat, cough, shortness of breath, skin infections, dysuria, diarrhea, or vaginal infections.  She recently had an STD check which was normal.  She denies any history other history of autoimmune diseases besides Grave's disease.  She denies any history of smoking tobacco.  She denies any steroid use.  She denies any night sweats or significant lymphadenopathy.  She denies any nausea, vomiting, diarrhea, or constipation.  She denies any abnormal bleeding or bruising.   The patient's family history is unremarkable.  The patient's mother had skin cancer and hyperlipidemia.  The patient's father has thyroid dysfunction as well as ALS.  The patient's sister is otherwise healthy.  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  She is not married but lives with her boyfriend.  She does not have any children.  She denies any drug abuse.  She states she has approximately 3 alcoholic beverages per week.  She is not use any tobacco products.  HPI  Past Medical History:  Diagnosis Date  . Anxiety   . Depression   . Graves disease    goiter  . Hypertension   . Hypothyroidism 2015  . Lyme disease 2008    Past Surgical History:  Procedure Laterality Date  . CYST EXCISION  from face 2018  . WISDOM TOOTH EXTRACTION      Family History  Problem Relation Age of Onset  . Thyroid disease Father   . ALS Father   . Depression Sister     Social History Social History   Tobacco Use  . Smoking status: Never Smoker  . Smokeless tobacco: Never Used  Substance Use Topics  . Alcohol use: Yes    Alcohol/week: 2.0 standard drinks    Types: 2 Standard drinks or equivalent per  week  . Drug use: No    No Known Allergies  Current Outpatient Medications  Medication Sig Dispense Refill  . levothyroxine (SYNTHROID, LEVOTHROID) 112 MCG tablet Take 112 mcg by mouth daily before breakfast.   5  . acetaminophen (TYLENOL) 325 MG tablet Take 325 mg by mouth as needed (pain).      No current facility-administered medications for this visit.    Review of Systems  REVIEW OF SYSTEMS:   Review of Systems  Constitutional: Negative for appetite change, chills, fatigue, fever and unexpected weight change.  HENT: Negative for mouth sores, nosebleeds, sore throat and trouble swallowing.   Eyes: Negative for eye problems and icterus.  Respiratory: Negative for cough, hemoptysis, shortness of breath and wheezing.  Cardiovascular: Negative for chest pain and leg swelling.  Gastrointestinal: Negative for abdominal pain, constipation, diarrhea, nausea and vomiting.  Genitourinary: Negative for bladder incontinence, difficulty urinating, dysuria, frequency and hematuria.   Musculoskeletal: Negative for back pain, gait problem, neck pain and neck stiffness.  Skin: Negative for itching and rash.  Neurological: Negative for dizziness, extremity weakness, gait problem, headaches, light-headedness and seizures.  Hematological: Negative for adenopathy. Does not bruise/bleed easily.  Psychiatric/Behavioral: Negative for confusion, depression and sleep disturbance. The patient is not nervous/anxious today.     PHYSICAL EXAMINATION:  Blood pressure (!) 160/97, pulse (!) 53, temperature 98.9 F (37.2 C), temperature source Temporal, resp. rate 20, height '5\' 3"'  (1.6 m), weight 121 lb 12.8 oz (55.2 kg), SpO2 100 %.  ECOG PERFORMANCE STATUS: 0  Physical Exam  Constitutional: Oriented to person, place, and time and well-developed, well-nourished, and in no distress. HENT:  Head: Normocephalic and atraumatic.  Mouth/Throat: Oropharynx is clear and moist. No oropharyngeal exudate.  Eyes:  Conjunctivae are normal. Right eye exhibits no discharge. Left eye exhibits no discharge. No scleral icterus.  Neck: Normal range of motion. Neck supple.  Cardiovascular: Normal rate, regular rhythm, normal heart sounds and intact distal pulses.   Pulmonary/Chest: Effort normal and breath sounds normal. No respiratory distress. No wheezes. No rales.  Abdominal: Soft. Bowel sounds are normal. Exhibits no distension and no mass. There is no tenderness.  Musculoskeletal: Normal range of motion. Exhibits no edema.  Lymphadenopathy:    No cervical adenopathy.  Neurological: Alert and oriented to person, place, and time. Exhibits normal muscle tone. Gait normal. Coordination normal.  Skin: Skin is warm and dry. No rash noted. Not diaphoretic. No erythema. No pallor.  Psychiatric: Mood, memory and judgment normal.  Vitals reviewed.  LABORATORY DATA: Lab Results  Component Value Date   WBC 8.6 05/25/2019   HGB 13.7 05/25/2019   HCT 40.9 05/25/2019   MCV 95.8 05/25/2019   PLT 333 05/25/2019      Chemistry      Component Value Date/Time   NA 137 01/30/2018 2100   K 3.4 (L) 01/30/2018 2100   CL 101 01/30/2018 2100   CO2 25 01/30/2018 2100   BUN 6 01/30/2018 2100   CREATININE 0.75  01/30/2018 2100      Component Value Date/Time   CALCIUM 9.8 01/30/2018 2100       RADIOGRAPHIC STUDIES: No results found.  ASSESSMENT: This is a very pleasant 29 year old Caucasian female referred to the clinic for evaluation of leukocytosis.  PLAN: The patient was seen with Dr. Julien Nordmann today.  The patient had a repeat CBC and LDH performed today.  The patient CBC and LDH were unremarkable.  The patient's mildly elevated white blood cell count from her prior CBC was likely reactive.   Dr. Julien Nordmann does not recommend further workup.   Recommend that she continue to follow with her PCP for routine blood work.   She will continue to follow with her endocrinologist. Advised her to check her blood pressure  at home. If her blood pressure continues to be elevated, advised her to follow up with her endocrinologist. Her work up has thus far been negative.    The patient voices understanding of current disease status and treatment options and is in agreement with the current care plan.  All questions were answered. The patient knows to call the clinic with any problems, questions or concerns. We can certainly see the patient much sooner if necessary.  Thank you so much for allowing me to participate in the care of Tanya Valencia. I will continue to follow up the patient with you and assist in her care.  The total time spent in the appointment was 60 minutes.  Disclaimer: This note was dictated with voice recognition software. Similar sounding words can inadvertently be transcribed and may not be corrected upon review.   Demetria Lightsey L Myldred Raju May 25, 2019, 2:10 PM  ADDENDUM: Hematology/Oncology Attending: I had a face-to-face encounter with the patient today.  I recommended her care plan.  This is a very pleasant 30 years old white female who presented for evaluation of leukocytosis.  The patient has been dealing with several endocrine issues with hypertension as well as an anxiety attacks and agitation.  She was noted on previous blood work to have elevated total white blood count up to 18,000 last year.  Repeat blood work several months ago showed mild elevation of her total white blood count in the range of 11,000 and occasionally she has normal total white blood count. We repeated her CBC today and the patient has normal leukocyte count with no other significant abnormalities. Her leukocytosis is likely reactive in nature secondary to endocrine and hormonal abnormalities. I assured the patient today and recommended for her to follow-up with Dr. Buddy Duty, her endocrinologist for further evaluation and management of her condition. We will be happy to see the patient in the future if needed. The  patient was also advised to call if she has any other concerning issues.  Disclaimer: This note was dictated with voice recognition software. Similar sounding words can inadvertently be transcribed and may be missed upon review. Eilleen Kempf, MD 05/25/19

## 2019-09-12 IMAGING — CT CT ANGIO CHEST
2 of 7 series · 19 of 46 positions shown · IV contrast (APPLIED)
Comparison: 05/22/2017 CT chest.

CLINICAL DATA: 27 y/o F; shortness of breath and back pain for
1-1/2 days.

EXAM:
CT ANGIOGRAPHY CHEST WITH CONTRAST
TECHNIQUE: Multidetector CT imaging of the chest was performed using the
standard protocol during bolus administration of intravenous
contrast. Multiplanar CT image reconstructions and MIPs were
obtained to evaluate the vascular anatomy.
CONTRAST:  44mL E8670C-UVS IOPAMIDOL (E8670C-UVS) INJECTION 76%

[Series 7: thins · axial · 0.58mm/px · z∈[+952,+1213]mm · 16 of 420 slices shown]
[im 24/420  lung]
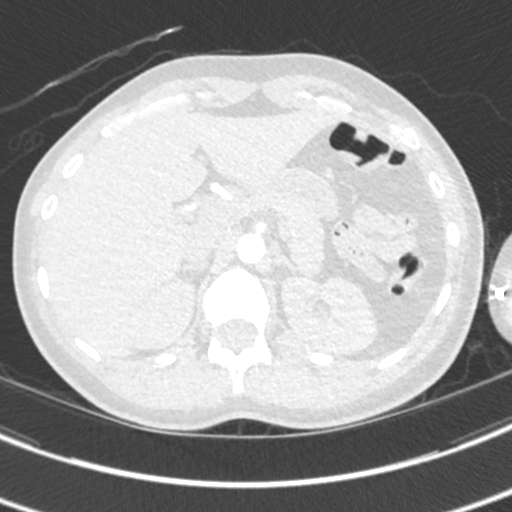
[im 47/420  soft-tissue]
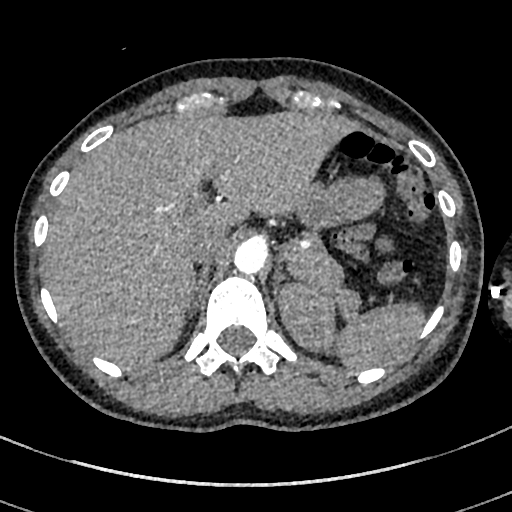
[im 70/420  lung]
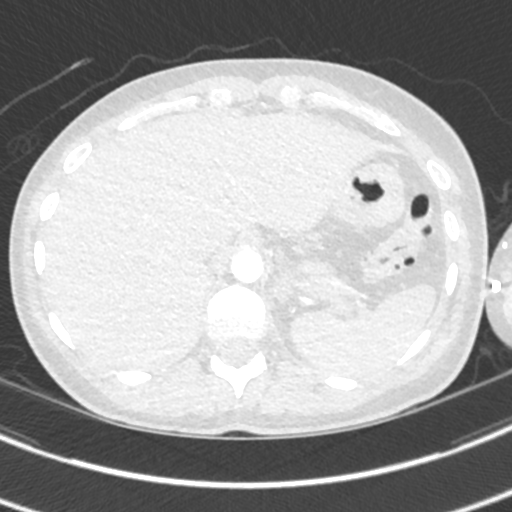
[im 94/420  soft-tissue]
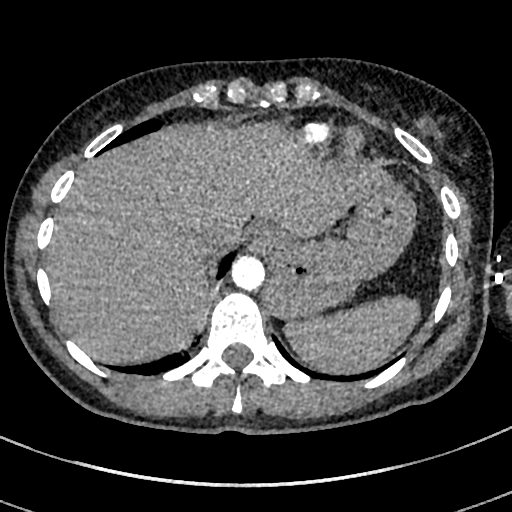
[im 117/420  lung]
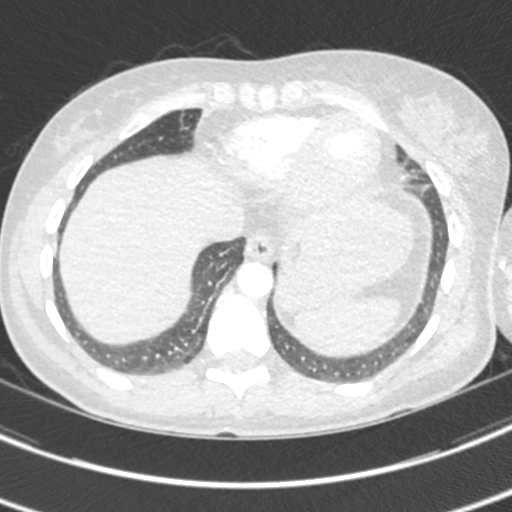
[im 140/420  soft-tissue]
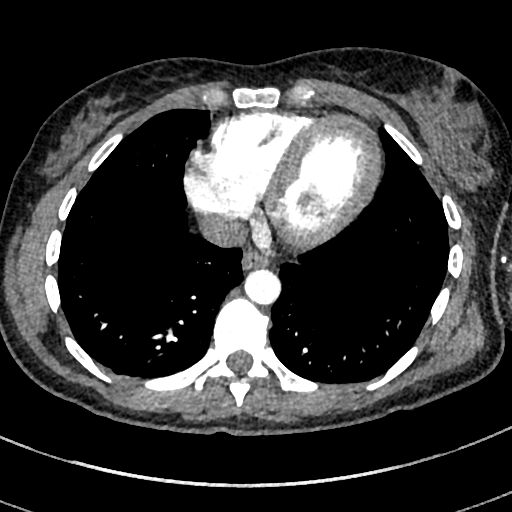
[im 163/420  lung]
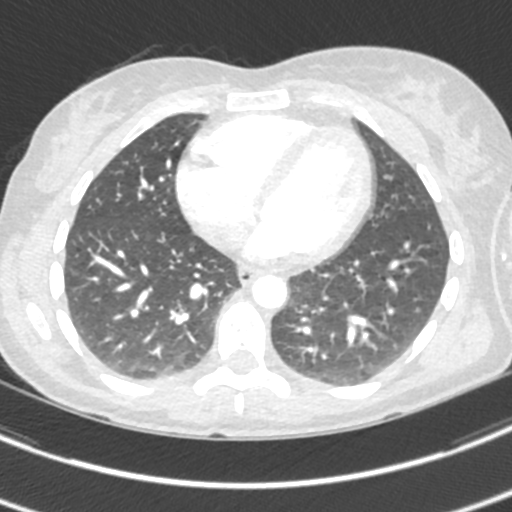
[im 187/420  soft-tissue]
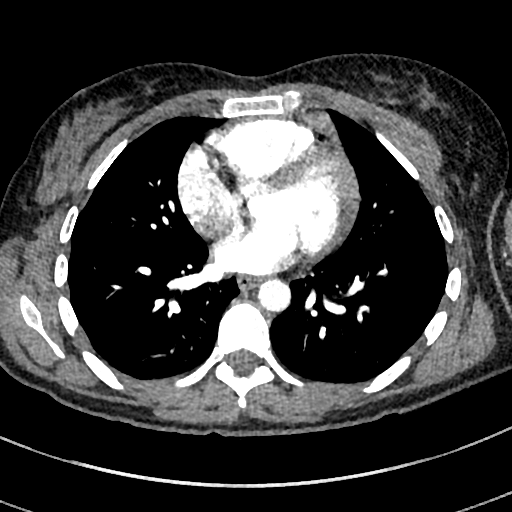
[im 233/420  lung]
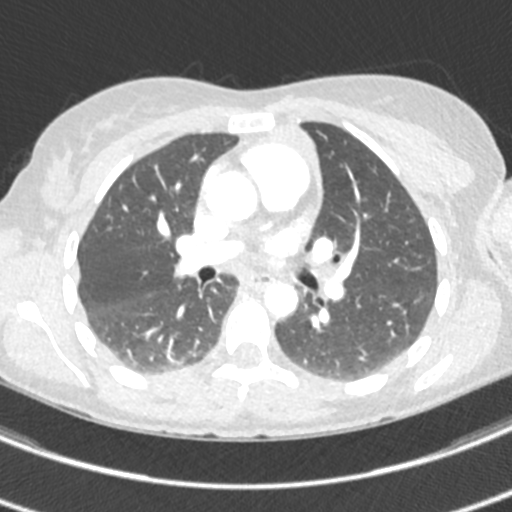
[im 257/420  soft-tissue]
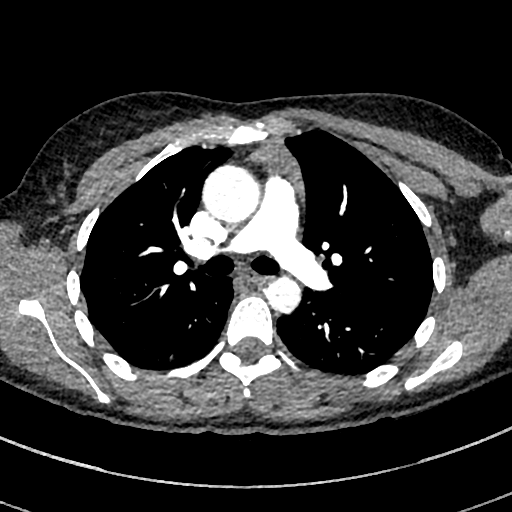
[im 280/420  lung]
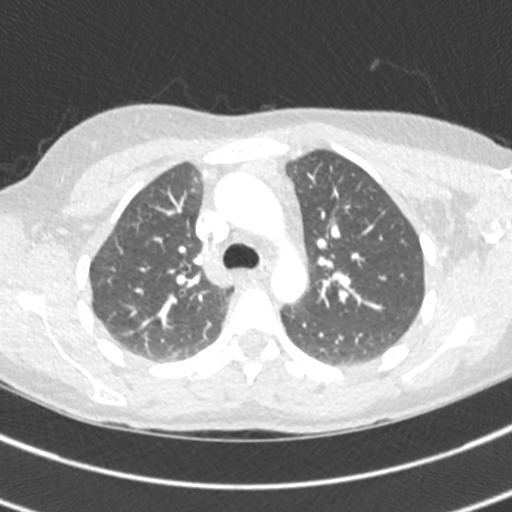
[im 303/420  soft-tissue]
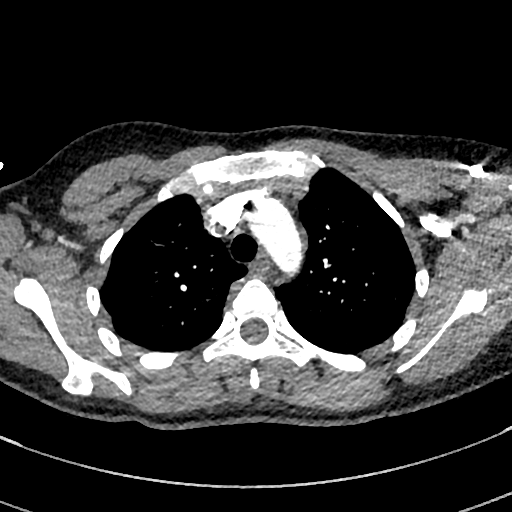
[im 326/420  lung]
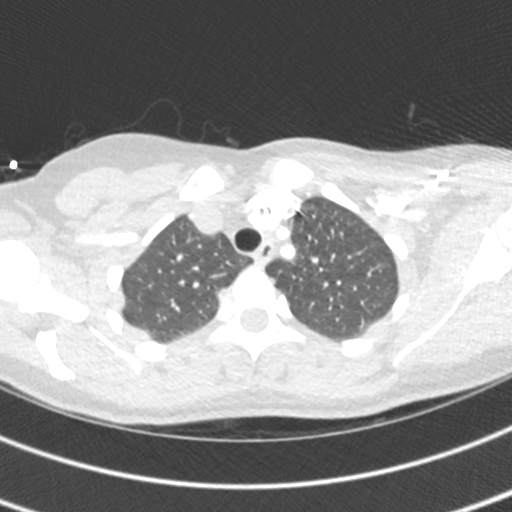
[im 350/420  soft-tissue]
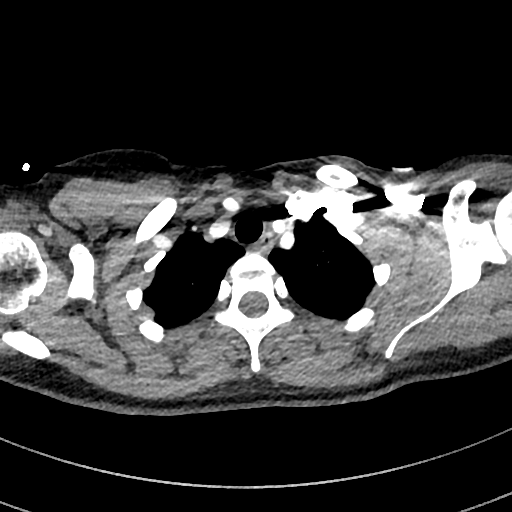
[im 373/420  lung]
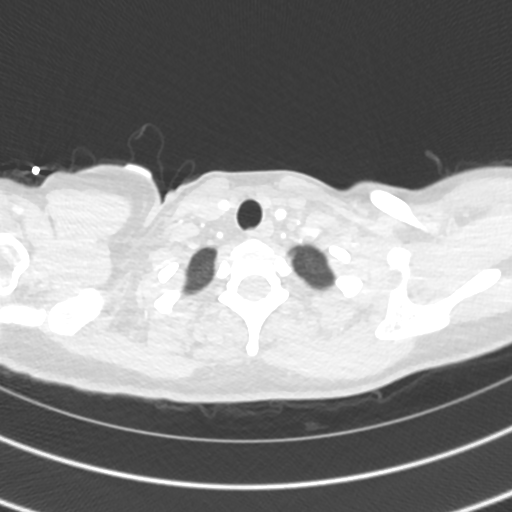
[im 396/420  soft-tissue]
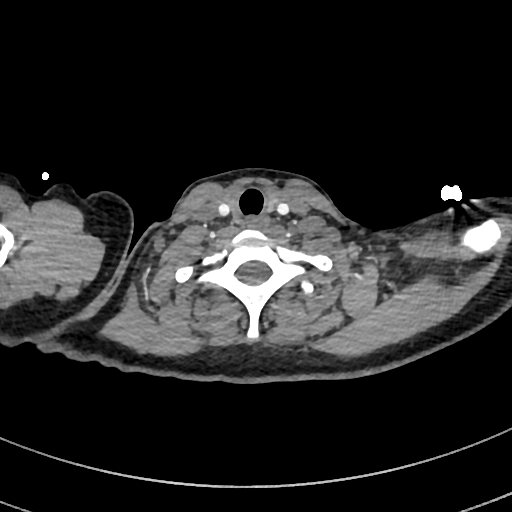

[Series 8: cor · coronal · 0.59mm/px · 3 of 106 slices shown]
[im 27/106  soft-tissue]
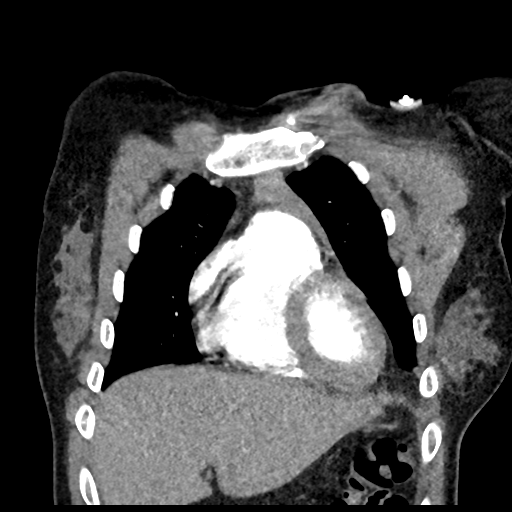
[im 53/106  soft-tissue]
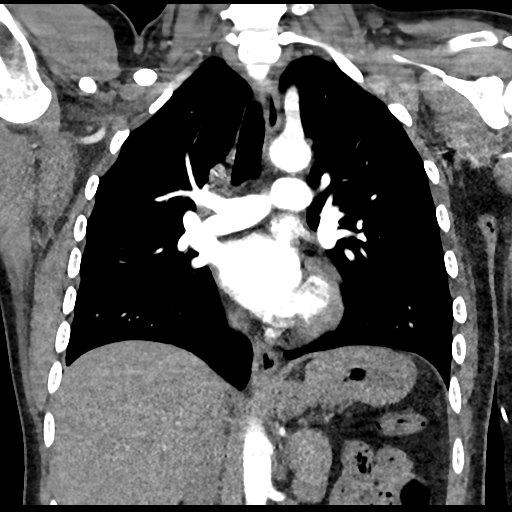
[im 79/106  soft-tissue]
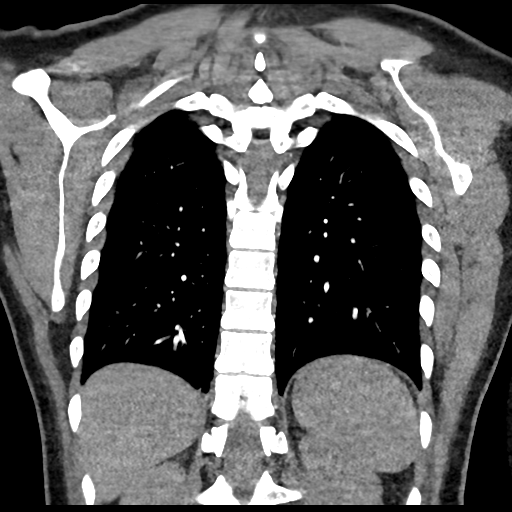

[19 of 46 positions shown; findings below may reference images not displayed]

FINDINGS: Cardiovascular: Satisfactory opacification of the pulmonary arteries
to the segmental level. No evidence of pulmonary embolism. Normal
heart size. No pericardial effusion.

Mediastinum/Nodes: No enlarged mediastinal, hilar, or axillary lymph
nodes. Thyroid gland, trachea, and esophagus demonstrate no
significant findings.

Lungs/Pleura: Lungs are clear. No pleural effusion or pneumothorax.

Upper Abdomen: No acute abnormality.

Musculoskeletal: No chest wall abnormality. No acute or significant
osseous findings.

Review of the MIP images confirms the above findings.
IMPRESSION: 1. No pulmonary embolus identified.
2. Unremarkable CTA of the chest.

## 2020-02-02 ENCOUNTER — Ambulatory Visit: Payer: BC Managed Care – PPO | Admitting: Certified Nurse Midwife

## 2020-02-08 NOTE — Progress Notes (Signed)
29 y.o. G0P0000 Single White or Caucasian female here for annual exam.   Period Duration (Days): 4 Period Pattern: Regular Menstrual Flow: Moderate Menstrual Control: Tampon Dysmenorrhea: (!) Moderate Dysmenorrhea Symptoms: Cramping,Diarrhea,Headache  Patient's last menstrual period was 02/01/2020 (exact date).          Sexually active: Yes.    The current method of family planning is condoms sometimes.    Exercising: Yes.    walking & home workouts Smoker:  no  Health Maintenance: Pap:  01-08-17 neg History of abnormal Pap:  no MMG:  none Colonoscopy:  none BMD:   none TDaP:  2020 Gardasil:   completed Covid-19: pfizer Hep C testing: neg 2020    reports that she has never smoked. She has never used smokeless tobacco. She reports current alcohol use of about 2.0 standard drinks of alcohol per week. She reports that she does not use drugs.  Past Medical History:  Diagnosis Date  . Anxiety   . Depression   . Graves disease    goiter  . Hypertension   . Hypothyroidism 2015  . Lyme disease 2008    Past Surgical History:  Procedure Laterality Date  . CYST EXCISION     from face 2018  . WISDOM TOOTH EXTRACTION      Current Outpatient Medications  Medication Sig Dispense Refill  . acetaminophen (TYLENOL) 325 MG tablet Take 325 mg by mouth as needed (pain).     Marland Kitchen acyclovir ointment (ZOVIRAX) 5 % 1 application to affected area    . levothyroxine (SYNTHROID, LEVOTHROID) 112 MCG tablet Take 112 mcg by mouth daily before breakfast.   5   No current facility-administered medications for this visit.    Family History  Problem Relation Age of Onset  . Thyroid disease Father   . ALS Father   . Depression Sister     Review of Systems  Constitutional: Negative.   HENT: Negative.   Eyes: Negative.   Respiratory: Negative.   Cardiovascular: Negative.   Gastrointestinal: Negative.   Endocrine: Negative.   Genitourinary: Negative.   Musculoskeletal: Negative.   Skin:  Negative.   Allergic/Immunologic: Negative.   Neurological: Negative.   Hematological: Negative.   Psychiatric/Behavioral: Negative.     Exam:   BP 110/70   Pulse 70   Resp 16   Ht 5' 3.5" (1.613 m)   Wt 120 lb (54.4 kg)   LMP 02/01/2020 (Exact Date)   BMI 20.92 kg/m   Height: 5' 3.5" (161.3 cm)  General appearance: alert, cooperative and appears stated age, no acute distress Head: Normocephalic, without obvious abnormality Neck: no adenopathy, thyroid normal to inspection and palpation Lungs: clear to auscultation bilaterally Breasts: normal appearance, no masses or tenderness Heart: regular rate and rhythm Abdomen: soft, non-tender; no masses,  no organomegaly Extremities: extremities normal, no edema Skin: No rashes or lesions Lymph nodes: Cervical, supraclavicular, and axillary nodes normal. No abnormal inguinal nodes palpated Neurologic: Grossly normal   Pelvic: External genitalia:  no lesions              Urethra:  normal appearing urethra with no masses, tenderness or lesions              Bartholins and Skenes: normal                 Vagina: normal appearing vagina, appropriate for age, normal appearing discharge, no lesions              Cervix: neg cervical motion tenderness,  no visible lesions             Bimanual Exam:   Uterus:  normal size, contour, position, consistency, mobility, non-tender and anteverted              Adnexa: no mass, fullness, tenderness                 Joy, CMA Chaperone was present for exam.  A:  Well Woman with normal exam  OCP initiation  P:   Pap : collected today  Medications: micronor, start with onset of menses, #3/4 rf  Discussed Kyleena vs paragard, pt will think about (info provided)

## 2020-02-09 ENCOUNTER — Encounter: Payer: Self-pay | Admitting: Nurse Practitioner

## 2020-02-09 ENCOUNTER — Other Ambulatory Visit (HOSPITAL_COMMUNITY)
Admission: RE | Admit: 2020-02-09 | Discharge: 2020-02-09 | Disposition: A | Discharge status: Home / Self Care | Payer: BC Managed Care – PPO | Source: Ambulatory Visit | Attending: Obstetrics and Gynecology | Admitting: Obstetrics and Gynecology

## 2020-02-09 ENCOUNTER — Other Ambulatory Visit: Payer: Self-pay

## 2020-02-09 ENCOUNTER — Ambulatory Visit: Payer: BC Managed Care – PPO | Admitting: Nurse Practitioner

## 2020-02-09 VITALS — BP 110/70 | HR 70 | Resp 16 | Ht 63.5 in | Wt 120.0 lb

## 2020-02-09 DIAGNOSIS — Z01419 Encounter for gynecological examination (general) (routine) without abnormal findings: Secondary | ICD-10-CM | POA: Insufficient documentation

## 2020-02-09 DIAGNOSIS — Z30011 Encounter for initial prescription of contraceptive pills: Secondary | ICD-10-CM | POA: Diagnosis not present

## 2020-02-09 MED ORDER — NORETHINDRONE 0.35 MG PO TABS: 1.0000 | ORAL_TABLET | Freq: Every day | ORAL | 4 refills | Status: DC

## 2020-02-09 NOTE — Patient Instructions (Signed)
Health Maintenance, Female Adopting a healthy lifestyle and getting preventive care are important in promoting health and wellness. Ask your health care provider about:  The right schedule for you to have regular tests and exams.  Things you can do on your own to prevent diseases and keep yourself healthy. What should I know about diet, weight, and exercise? Eat a healthy diet   Eat a diet that includes plenty of vegetables, fruits, low-fat dairy products, and lean protein.  Do not eat a lot of foods that are high in solid fats, added sugars, or sodium. Maintain a healthy weight Body mass index (BMI) is used to identify weight problems. It estimates body fat based on height and weight. Your health care provider can help determine your BMI and help you achieve or maintain a healthy weight. Get regular exercise Get regular exercise. This is one of the most important things you can do for your health. Most adults should:  Exercise for at least 150 minutes each week. The exercise should increase your heart rate and make you sweat (moderate-intensity exercise).  Do strengthening exercises at least twice a week. This is in addition to the moderate-intensity exercise.  Spend less time sitting. Even light physical activity can be beneficial. Watch cholesterol and blood lipids Have your blood tested for lipids and cholesterol at 29 years of age, then have this test every 5 years. Have your cholesterol levels checked more often if:  Your lipid or cholesterol levels are high.  You are older than 29 years of age.  You are at high risk for heart disease. What should I know about cancer screening? Depending on your health history and family history, you may need to have cancer screening at various ages. This may include screening for:  Breast cancer.  Cervical cancer.  Colorectal cancer.  Skin cancer.  Lung cancer. What should I know about heart disease, diabetes, and high blood  pressure? Blood pressure and heart disease  High blood pressure causes heart disease and increases the risk of stroke. This is more likely to develop in people who have high blood pressure readings, are of African descent, or are overweight.  Have your blood pressure checked: ? Every 3-5 years if you are 18-39 years of age. ? Every year if you are 40 years old or older. Diabetes Have regular diabetes screenings. This checks your fasting blood sugar level. Have the screening done:  Once every three years after age 40 if you are at a normal weight and have a low risk for diabetes.  More often and at a younger age if you are overweight or have a high risk for diabetes. What should I know about preventing infection? Hepatitis B If you have a higher risk for hepatitis B, you should be screened for this virus. Talk with your health care provider to find out if you are at risk for hepatitis B infection. Hepatitis C Testing is recommended for:  Everyone born from 1945 through 1965.  Anyone with known risk factors for hepatitis C. Sexually transmitted infections (STIs)  Get screened for STIs, including gonorrhea and chlamydia, if: ? You are sexually active and are younger than 29 years of age. ? You are older than 29 years of age and your health care provider tells you that you are at risk for this type of infection. ? Your sexual activity has changed since you were last screened, and you are at increased risk for chlamydia or gonorrhea. Ask your health care provider if   you are at risk.  Ask your health care provider about whether you are at high risk for HIV. Your health care provider may recommend a prescription medicine to help prevent HIV infection. If you choose to take medicine to prevent HIV, you should first get tested for HIV. You should then be tested every 3 months for as long as you are taking the medicine. Pregnancy  If you are about to stop having your period (premenopausal) and  you may become pregnant, seek counseling before you get pregnant.  Take 400 to 800 micrograms (mcg) of folic acid every day if you become pregnant.  Ask for birth control (contraception) if you want to prevent pregnancy. Osteoporosis and menopause Osteoporosis is a disease in which the bones lose minerals and strength with aging. This can result in bone fractures. If you are 65 years old or older, or if you are at risk for osteoporosis and fractures, ask your health care provider if you should:  Be screened for bone loss.  Take a calcium or vitamin D supplement to lower your risk of fractures.  Be given hormone replacement therapy (HRT) to treat symptoms of menopause. Follow these instructions at home: Lifestyle  Do not use any products that contain nicotine or tobacco, such as cigarettes, e-cigarettes, and chewing tobacco. If you need help quitting, ask your health care provider.  Do not use street drugs.  Do not share needles.  Ask your health care provider for help if you need support or information about quitting drugs. Alcohol use  Do not drink alcohol if: ? Your health care provider tells you not to drink. ? You are pregnant, may be pregnant, or are planning to become pregnant.  If you drink alcohol: ? Limit how much you use to 0-1 drink a day. ? Limit intake if you are breastfeeding.  Be aware of how much alcohol is in your drink. In the [place] one drink equals one 12 oz bottle of beer (355 mL), one 5 oz glass of wine (148 mL), or one 1 oz glass of hard liquor (44 mL). General instructions  Schedule regular health, dental, and eye exams.  Stay current with your vaccines.  Tell your health care provider if: ? You often feel depressed. ? You have ever been abused or do not feel safe at home. Summary  Adopting a healthy lifestyle and getting preventive care are important in promoting health and wellness.  Follow your health care provider's instructions about healthy  diet, exercising, and getting tested or screened for diseases.  Follow your health care provider's instructions on monitoring your cholesterol and blood pressure. This information is not intended to replace advice given to you by your health care provider. Make sure you discuss any questions you have with your health care provider. Document Revised: 01/29/2018 Document Reviewed: 01/29/2018 Elsevier Patient Education  2020 [employer]  

## 2020-02-10 LAB — CYTOLOGY - PAP: Diagnosis: NEGATIVE

## 2020-09-08 ENCOUNTER — Other Ambulatory Visit: Payer: Self-pay

## 2020-09-08 ENCOUNTER — Ambulatory Visit: Payer: BC Managed Care – PPO | Attending: Family Medicine | Admitting: Physical Therapy

## 2020-09-08 DIAGNOSIS — M5442 Lumbago with sciatica, left side: Secondary | ICD-10-CM | POA: Insufficient documentation

## 2020-09-08 NOTE — Therapy (Signed)
[facility] [address] [place], [place], [address] Phone: [phone]   Fax:  [phone]  Patient Details  Name: Tanya Valencia MRN: [id number] Date of Birth: 1990-11-24 Referring Provider:  Orvan July, NP  Encounter Date: 09/08/2020  Patient arriving for 4:15 evaluation. Per front desk, patient electing to leave without being seen due to concerns with regards to cost of care. Therapist opening chart only to begin preliminary standard documentation. Therapist did not speak with patient.  Everardo All PT, DPT  09/08/20 4:33 PM   [facility] [address], [place], [place], [address] Phone: [phone]   Fax:  [phone]

## 2021-02-23 ENCOUNTER — Encounter: Payer: Self-pay | Admitting: Nurse Practitioner

## 2021-02-23 ENCOUNTER — Ambulatory Visit: Payer: BC Managed Care – PPO | Admitting: Nurse Practitioner

## 2021-02-23 ENCOUNTER — Ambulatory Visit (INDEPENDENT_AMBULATORY_CARE_PROVIDER_SITE_OTHER): Payer: BC Managed Care – PPO | Admitting: Nurse Practitioner

## 2021-02-23 ENCOUNTER — Other Ambulatory Visit: Payer: Self-pay

## 2021-02-23 VITALS — BP 92/68 | HR 91 | Ht 63.0 in | Wt 135.0 lb

## 2021-02-23 DIAGNOSIS — Z01419 Encounter for gynecological examination (general) (routine) without abnormal findings: Secondary | ICD-10-CM

## 2021-02-23 DIAGNOSIS — N940 Mittelschmerz: Secondary | ICD-10-CM

## 2021-02-23 DIAGNOSIS — L700 Acne vulgaris: Secondary | ICD-10-CM | POA: Diagnosis not present

## 2021-02-23 DIAGNOSIS — Z30011 Encounter for initial prescription of contraceptive pills: Secondary | ICD-10-CM | POA: Diagnosis not present

## 2021-02-23 MED ORDER — DROSPIRENONE-ETHINYL ESTRADIOL 3-0.02 MG PO TABS: 1.0000 | ORAL_TABLET | Freq: Every day | ORAL | 3 refills | Status: DC

## 2021-02-23 NOTE — Progress Notes (Signed)
Tanya Valencia 01-Jun-1990 [id number]   History:  31 y.o. G0 presents for annual exam. Monthly cycles. She is interested in restarting hormonal contraception but prefers combination pills. She was on these in the past and had elevated blood pressure at one point. Around that time she was also diagnosed with Grave's disease and anxiety was also a questionable cause. No blood pressure issues since thyroid disease has been under control. She also has acne and recently completed Accutane. She complains of monthly ovulation pain. Takes Tylenol or Ibuprofen for management. Normal pap history. History of anxiety, depression, Graves disease.  Gynecologic History Patient's last menstrual period was 01/30/2021 (exact date). Period Cycle (Days):  (28) Period Duration (Days): 4 Menstrual Flow:  (mod-light) Menstrual Control: Maxi pad, Tampon Dysmenorrhea: (!) Mild Dysmenorrhea Symptoms: Cramping, Headache Contraception/Family planning: oral progesterone-only contraceptive Sexually active: Yes  Health Maintenance Last Pap: 02/09/2020. Results were: Normal, 3-year repeat Last mammogram: Not indicated Last colonoscopy: Not indicated Last Dexa: Not indicated  Past medical history, past surgical history, family history and social history were all reviewed and documented in the EPIC chart. Boyfriend. 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   ROS:  A ROS was performed and pertinent positives and negatives are included.  Exam:  Vitals:   02/23/21 1137  BP: 92/68  Pulse: 91  SpO2: 98%  Weight: 135 lb (61.2 kg)  Height: 5\' 3"  (1.6 m)   Body mass index is 23.91 kg/m.  General appearance:  Normal Thyroid:  Symmetrical, normal in size, without palpable masses or nodularity. Respiratory  Auscultation:  Clear without wheezing or rhonchi Cardiovascular  Auscultation:  Regular rate, without rubs, murmurs or gallops  Edema/varicosities:  Not grossly evident Abdominal  Soft,nontender, without  masses, guarding or rebound.  Liver/spleen:  No organomegaly noted  Hernia:  None appreciated  Skin  Inspection:  Grossly normal Breasts: Examined lying and sitting.   Right: Without masses, retractions, nipple discharge or axillary adenopathy.   Left: Without masses, retractions, nipple discharge or axillary adenopathy. Genitourinary   Inguinal/mons:  Normal without inguinal adenopathy  External genitalia:  Normal appearing vulva with no masses, tenderness, or lesions  BUS/Urethra/Skene's glands:  Normal  Vagina:  Normal appearing with normal color and discharge, no lesions  Cervix:  Normal appearing without discharge or lesions  Uterus:  Normal in size, shape and contour.  Midline and mobile, nontender  Adnexa/parametria:     Rt: Normal in size, without masses or tenderness.   Lt: Normal in size, without masses or tenderness.  Anus and perineum: Normal  Patient informed chaperone available to be present for breast and pelvic exam. Patient has requested no chaperone to be present. Patient has been advised what will be completed during breast and pelvic exam.   Assessment/Plan:  31 y.o. G0 for annual exam.   Well female exam with routine gynecological exam - Education provided on SBEs, importance of preventative screenings, current guidelines, high calcium diet, regular exercise, and multivitamin daily. Labs with PCP.   Ovulation pain - Plan: drospirenone-ethinyl estradiol (YAZ) 3-0.02 MG tablet daily. Reassured that this is a normal symptom of ovulation. She may use heating pad and/or Tylenol or ibuprofen for management. OCPs will also help with this.   Acne vulgaris - Plan: drospirenone-ethinyl estradiol (YAZ) 3-0.02 MG tablet daily. Completed Accutane recently.   Encounter for initial prescription of contraceptive pills - Plan: drospirenone-ethinyl estradiol (YAZ) 3-0.02 MG tablet daily. She is aware of proper use and will start with next menses. She will monitor  her blood pressure.    Screening for cervical cancer - Normal Pap history.  Will repeat at 3-year interval per guidelines.  Return in 1 year for annual.     Tamela Gammon DNP, 11:57 AM 02/23/2021

## 2021-02-24 ENCOUNTER — Ambulatory Visit: Payer: BC Managed Care – PPO | Admitting: Nurse Practitioner

## 2021-11-15 ENCOUNTER — Ambulatory Visit: Payer: BC Managed Care – PPO | Admitting: Nurse Practitioner

## 2021-11-15 VITALS — BP 138/80

## 2021-11-15 DIAGNOSIS — Z3041 Encounter for surveillance of contraceptive pills: Secondary | ICD-10-CM

## 2021-11-15 DIAGNOSIS — Z3009 Encounter for other general counseling and advice on contraception: Secondary | ICD-10-CM

## 2021-11-15 MED ORDER — NORETHINDRONE 0.35 MG PO TABS: 1.0000 | ORAL_TABLET | Freq: Every day | ORAL | 1 refills | Status: DC

## 2021-11-15 NOTE — Progress Notes (Signed)
   Acute Office Visit  Subjective:    Patient ID: Tanya Valencia, female    DOB: 1990-10-27, 31 y.o.   MRN: [id number]   HPI 31 y.o. presents today to discuss birth control. Currently on generic Yaz. She feels it may be affecting her moods and is worried it will cause blood pressure elevation. Interested in progestin-only pill. Originally started on Yaz in January to help with acne and ovulation pains. In the past she had elevated blood pressures while taking COCs but was also diagnosed with Graves during that time and was experiencing high anxiety. No issues with blood pressure since control of thyroid disease.    Review of Systems  Constitutional: Negative.   Genitourinary: Negative.        Objective:    Physical Exam Constitutional:      Appearance: Normal appearance.   GU: not indicated  BP 138/80 (BP Location: Left Arm, Patient Position: Sitting, Cuff Size: Normal)   LMP 11/05/2021 (Exact Date)  Wt Readings from Last 3 Encounters:  02/23/21 135 lb (61.2 kg)  02/09/20 120 lb (54.4 kg)  05/25/19 121 lb 12.8 oz (55.2 kg)        Patient informed chaperone available to be present for breast and/or pelvic exam. Patient has requested no chaperone to be present. Patient has been advised what will be completed during breast and pelvic exam.   Assessment & Plan:   Problem List Items Addressed This Visit   None Visit Diagnoses     General counseling and advice on female contraception    -  Primary   Encounter for surveillance of contraceptive pills       Relevant Medications   norethindrone (ORTHO MICRONOR) 0.35 MG tablet      Plan: Will switch to POPs. Aware of proper use. Will finish current pill pack and start with next period. Discussed possible worsening acne with removal of estrogen and she understands and will follow up with derm if needed. Has annual exam in January so we will follow up then or sooner if needed.      Tamela Gammon DNP, 2:30 PM  11/15/2021

## 2022-01-25 ENCOUNTER — Other Ambulatory Visit: Payer: Self-pay | Admitting: Nurse Practitioner

## 2022-01-25 DIAGNOSIS — N940 Mittelschmerz: Secondary | ICD-10-CM

## 2022-01-25 DIAGNOSIS — L700 Acne vulgaris: Secondary | ICD-10-CM

## 2022-01-25 DIAGNOSIS — Z30011 Encounter for initial prescription of contraceptive pills: Secondary | ICD-10-CM

## 2022-01-25 NOTE — Telephone Encounter (Signed)
Last annual exam 02/2021 Patient was switched to [facility]  at this visit.

## 2022-02-15 ENCOUNTER — Encounter: Payer: Self-pay | Admitting: Nurse Practitioner

## 2022-02-21 ENCOUNTER — Other Ambulatory Visit: Payer: Self-pay | Admitting: Nurse Practitioner

## 2022-02-21 DIAGNOSIS — Z3041 Encounter for surveillance of contraceptive pills: Secondary | ICD-10-CM

## 2022-02-26 ENCOUNTER — Ambulatory Visit: Payer: BC Managed Care – PPO | Admitting: Nurse Practitioner

## 2022-03-13 ENCOUNTER — Encounter: Payer: Self-pay | Admitting: Nurse Practitioner

## 2022-03-13 ENCOUNTER — Ambulatory Visit (INDEPENDENT_AMBULATORY_CARE_PROVIDER_SITE_OTHER): Payer: BC Managed Care – PPO | Admitting: Nurse Practitioner

## 2022-03-13 ENCOUNTER — Other Ambulatory Visit (HOSPITAL_COMMUNITY)
Admission: RE | Admit: 2022-03-13 | Discharge: 2022-03-13 | Disposition: A | Discharge status: Home / Self Care | Payer: BC Managed Care – PPO | Source: Ambulatory Visit | Attending: Nurse Practitioner | Admitting: Nurse Practitioner

## 2022-03-13 VITALS — BP 120/70 | Ht 63.25 in | Wt 136.0 lb

## 2022-03-13 DIAGNOSIS — Z3041 Encounter for surveillance of contraceptive pills: Secondary | ICD-10-CM

## 2022-03-13 DIAGNOSIS — Z01419 Encounter for gynecological examination (general) (routine) without abnormal findings: Secondary | ICD-10-CM

## 2022-03-13 DIAGNOSIS — Z8742 Personal history of other diseases of the female genital tract: Secondary | ICD-10-CM

## 2022-03-13 MED ORDER — NORETHINDRONE 0.35 MG PO TABS: 1.0000 | ORAL_TABLET | Freq: Every day | ORAL | 3 refills | Status: DC

## 2022-03-13 NOTE — Progress Notes (Signed)
   Tanya Valencia 1990-10-11 [id number]   History:  32 y.o. G0 presents for annual exam. Monthly cycles. Felt she had mood changes on COCs, so she was switched to POPs. H/O elevated blood pressures on COCs but was also diagnosed with graves disease and having anxiety at that time. No BP issues since thyroid disease under control and no issues with most recent COC use. Normal pap history. History of anxiety, depression, Graves disease. [facility]. Negative vaginitis screening at that time. Symptoms have resolved.   Gynecologic History Patient's last menstrual period was 03/03/2022 (exact date). Period Cycle (Days): 28 Period Duration (Days): 5 Period Pattern: Regular Menstrual Flow: Moderate Menstrual Control: Tampon Dysmenorrhea: (!) Moderate Dysmenorrhea Symptoms: Cramping Contraception/Family planning: oral progesterone-only contraceptive Sexually active: Yes  Health Maintenance Last Pap: 02/09/2020. Results were: Normal, 3-year repeat Last mammogram: Not indicated Last colonoscopy: Not indicated Last Dexa: Not indicated  Past medical history, past surgical history, family history and social history were all reviewed and documented in the EPIC chart. Boyfriend. Works for Production assistant, radio at [employer]. Father with ALS.   ROS:  A ROS was performed and pertinent positives and negatives are included.  Exam:  Vitals:   03/13/22 1330  BP: 120/70  Weight: 136 lb (61.7 kg)  Height: 5' 3.25" (1.607 m)    Body mass index is 23.9 kg/m.  General appearance:  Normal Thyroid:  Symmetrical, normal in size, without palpable masses or nodularity. Respiratory  Auscultation:  Clear without wheezing or rhonchi Cardiovascular  Auscultation:  Regular rate, without rubs, murmurs or gallops  Edema/varicosities:  Not grossly evident Abdominal  Soft,nontender, without masses, guarding or rebound.  Liver/spleen:  No organomegaly noted  Hernia:  None  appreciated  Skin  Inspection:  Grossly normal Breasts: Examined lying and sitting.   Right: Without masses, retractions, nipple discharge or axillary adenopathy.   Left: Without masses, retractions, nipple discharge or axillary adenopathy. Genitourinary   Inguinal/mons:  Normal without inguinal adenopathy  External genitalia:  Normal appearing vulva with no masses, tenderness, or lesions  BUS/Urethra/Skene's glands:  Normal  Vagina:  Normal appearing with normal color and discharge, no lesions  Cervix:  Normal appearing without discharge or lesions  Uterus:  Normal in size, shape and contour.  Midline and mobile, nontender  Adnexa/parametria:     Rt: Normal in size, without masses or tenderness.   Lt: Normal in size, without masses or tenderness.  Anus and perineum: Normal  Patient informed chaperone available to be present for breast and pelvic exam. Patient has requested no chaperone to be present. Patient has been advised what will be completed during breast and pelvic exam.   Assessment/Plan:  32 y.o. G0 for annual exam.   Well female exam with routine gynecological exam - Plan: Cytology - PAP( [facility]). Education provided on SBEs, importance of preventative screenings, current guidelines, high calcium diet, regular exercise, and multivitamin daily. Labs with PCP.   Encounter for surveillance of contraceptive pills - Plan: norethindrone (MICRONOR) 0.35 MG tablet daily. Taking as prescribed. Monthly cycles. Refill x 1 year provided.   History of cervicitis - treated end of December at [facility] dept. Symptoms have resolved. Normal exam.   Screening for cervical cancer - Normal Pap history. Pap today due to recent cervical changes.   Return in 1 year for annual.     Tamela Gammon DNP, 1:59 PM 03/13/2022

## 2022-03-17 LAB — CYTOLOGY - PAP: Diagnosis: NEGATIVE

## 2022-05-03 ENCOUNTER — Ambulatory Visit: Payer: BC Managed Care – PPO | Admitting: Nurse Practitioner

## 2022-06-01 ENCOUNTER — Other Ambulatory Visit: Payer: Self-pay

## 2022-06-01 DIAGNOSIS — Z3041 Encounter for surveillance of contraceptive pills: Secondary | ICD-10-CM

## 2022-06-01 NOTE — Telephone Encounter (Signed)
TW pt calling to report being out of POPs and pharmacy has now closed. Needs refills sent to alternate pharmacy.  Last AEX 02/2022--recall placed for 2025.   Rx pend.

## 2022-06-02 MED ORDER — NORETHINDRONE 0.35 MG PO TABS: 1.0000 | ORAL_TABLET | Freq: Every day | ORAL | 2 refills | Status: DC

## 2022-08-09 ENCOUNTER — Other Ambulatory Visit: Payer: Self-pay

## 2022-08-09 ENCOUNTER — Emergency Department (HOSPITAL_BASED_OUTPATIENT_CLINIC_OR_DEPARTMENT_OTHER)
Admission: EM | Admit: 2022-08-09 | Discharge: 2022-08-09 | Disposition: A | Discharge status: Home / Self Care | Payer: BC Managed Care – PPO | Attending: Emergency Medicine | Admitting: Emergency Medicine

## 2022-08-09 DIAGNOSIS — Z2914 Encounter for prophylactic rabies immune globin: Secondary | ICD-10-CM | POA: Insufficient documentation

## 2022-08-09 DIAGNOSIS — I10 Essential (primary) hypertension: Secondary | ICD-10-CM | POA: Diagnosis not present

## 2022-08-09 DIAGNOSIS — W540XXA Bitten by dog, initial encounter: Secondary | ICD-10-CM | POA: Insufficient documentation

## 2022-08-09 DIAGNOSIS — Z203 Contact with and (suspected) exposure to rabies: Secondary | ICD-10-CM | POA: Diagnosis not present

## 2022-08-09 DIAGNOSIS — E039 Hypothyroidism, unspecified: Secondary | ICD-10-CM | POA: Insufficient documentation

## 2022-08-09 DIAGNOSIS — Z23 Encounter for immunization: Secondary | ICD-10-CM | POA: Insufficient documentation

## 2022-08-09 DIAGNOSIS — S71131A Puncture wound without foreign body, right thigh, initial encounter: Secondary | ICD-10-CM | POA: Insufficient documentation

## 2022-08-09 DIAGNOSIS — Z79899 Other long term (current) drug therapy: Secondary | ICD-10-CM | POA: Insufficient documentation

## 2022-08-09 MED ORDER — RABIES VACCINE, PCEC IM SUSR
1.0000 mL | Freq: Once | INTRAMUSCULAR | Status: AC
  Administered 2022-08-09: 1 mL via INTRAMUSCULAR
  Filled 2022-08-09: qty 1

## 2022-08-09 MED ORDER — AMOXICILLIN-POT CLAVULANATE 875-125 MG PO TABS: 1.0000 | ORAL_TABLET | Freq: Two times a day (BID) | ORAL | 0 refills | Status: DC

## 2022-08-09 MED ORDER — RABIES IMMUNE GLOBULIN 150 UNIT/ML IM INJ
20.0000 [IU]/kg | INJECTION | Freq: Once | INTRAMUSCULAR | Status: AC
  Administered 2022-08-09: 1200 [IU] via INTRAMUSCULAR
  Filled 2022-08-09: qty 8

## 2022-08-09 NOTE — ED Provider Notes (Signed)
[place] EMERGENCY DEPARTMENT AT [facility] Provider Note   CSN: [id number] Arrival date & time: 08/09/22  1128     History  Chief Complaint  Patient presents with   Animal Bite    Tanya Valencia is a 32 y.o. female with history of Graves' disease, pressure, Lyme disease, hypothyroidism, anxiety, hypertension who presents to the emergency department complaining of a dog bite.  Patient states that she was bit by a stray dog that spent in her neighborhood about 9 days ago.  The dog has been acting relatively normal, she was trying to get into the car to take it somewhere else, and it got caught on something, turning around and biting her on her right thigh.  She was keeping the area clean at home, and the bruising has been improving.  She went to her doctor today and they discussed some vague symptoms that she has been having the past couple days, so they referred her to the ER for rabies vaccination and immunoglobulin.  Yesterday she had some mild palpitations and a little bit of a headache, which she thinks could have been attributed to her caffeine ingestion.  She is not having any muscle aches, fever, or disorientation.   Animal Bite      Home Medications Prior to Admission medications   Medication Sig Start Date End Date Taking? Authorizing Provider  amoxicillin-clavulanate (AUGMENTIN) 875-125 MG tablet Take 1 tablet by mouth every 12 (twelve) hours. 08/09/22  Yes Shanyla Marconi T, PA-C  acetaminophen (TYLENOL) 325 MG tablet Take 325 mg by mouth as needed (pain).     [provider]  acyclovir ointment (ZOVIRAX) 5 % 1 application to affected area 04/02/14   [provider]  levothyroxine (SYNTHROID, LEVOTHROID) 112 MCG tablet Take 112 mcg by mouth daily before breakfast.  11/01/15   [provider]  norethindrone (MICRONOR) 0.35 MG tablet Take 1 tablet (0.35 mg total) by mouth daily. 06/02/22   Genia Del, MD  tretinoin (RETIN-A) 0.025 %  cream Apply topically. 02/22/21   [provider]      Allergies    Patient has no known allergies.    Review of Systems   Review of Systems  Skin:  Positive for wound.  All other systems reviewed and are negative.   Physical Exam Updated Vital Signs BP (!) 136/100 (BP Location: Right Arm)   Pulse 63   Temp 98.6 F (37 C) (Oral)   Resp 16   Ht 5\' 3"  (1.6 m)   Wt 59.9 kg   LMP 08/06/2022 (Exact Date)   SpO2 100%   BMI 23.38 kg/m  Physical Exam Vitals and nursing note reviewed.  Constitutional:      Appearance: Normal appearance.  HENT:     Head: Normocephalic and atraumatic.  Eyes:     Conjunctiva/sclera: Conjunctivae normal.  Pulmonary:     Effort: Pulmonary effort is normal. No respiratory distress.  Skin:    General: Skin is warm and dry.     Comments: Well-healing puncture wounds noted to the right anterior thigh with scattered ecchymoses, no erythema, or fluctuance  Neurological:     Mental Status: She is alert.  Psychiatric:        Mood and Affect: Mood normal.        Behavior: Behavior normal.     ED Results / Procedures / Treatments   Labs (all labs ordered are listed, but only abnormal results are displayed) Labs Reviewed - No data to display  EKG  None  Radiology No results found.  Procedures Procedures    Medications Ordered in ED Medications  rabies immune globulin (HYPERRAB/KEDRAB) injection 1,200 Units (1,200 Units Intramuscular Given 08/09/22 1247)  rabies vaccine (RABAVERT) injection 1 mL (1 mL Intramuscular Given 08/09/22 1243)    ED Course/ Medical Decision Making/ A&P                             Medical Decision Making Risk Prescription drug management.   Patient presents with wound from dog bite. Bit by stray dog on right thigh 9 days ago. Requesting rabies vaccination.   Wounds examined and no foreign bodies seen. Well healing wound. Pt Alert and oriented, NAD, nontoxic, nonseptic appearing.  Capillary refill intact  and pt without neurologic deficit.    Tdap up to date.  Animal was a stray, not up to date on vaccinations. Given rabies vaccination and immunoglobulin. Given return information for continued rabies vaccine series. We'll discharge home with Augmentin and give close return precautions.   Final Clinical Impression(s) / ED Diagnoses Final diagnoses:  Dog bite, initial encounter    Rx / DC Orders ED Discharge Orders          Ordered    amoxicillin-clavulanate (AUGMENTIN) 875-125 MG tablet  Every 12 hours        08/09/22 1310           Portions of this report may have been transcribed using voice recognition software. Every effort was made to ensure accuracy; however, inadvertent computerized transcription errors may be present.    Su Monks, PA-C 08/09/22 1314    Tanda Rockers A, DO 08/10/22 [phone]

## 2022-08-09 NOTE — ED Notes (Signed)
Report given to the next RN... 

## 2022-08-09 NOTE — ED Notes (Signed)
Pt verbalized understanding of d/c instructions, meds, and followup care. Denies questions. VSS, no distress noted. Steady gait to exit with all belongings.  ?

## 2022-08-09 NOTE — ED Notes (Signed)
Notify Animal control handled by Registration.Marland KitchenMarland Kitchen

## 2022-08-09 NOTE — Discharge Instructions (Addendum)
You were seen in the emergency department after an animal bite.  As we discussed, although rabies transmission to humans is rare, it is incurable if you develop symptoms. This is why we go ahead and update your vaccine status to ensure your health and safety. We have given you both the rabies vaccine and rabies immunoglobulin.   You will need additional rabies vaccinations on days 3, 7, and 14. For you this means you will require vaccinations on: 6/23, 6/27, 7/4. You can have this done at your doctor's office, urgent care, or ER.  We have also cleaned your wound, made sure your tetanus vaccine is up to date, and I'm prescribing you a course of antibiotics. It is important you finish the entire course!  Continue to monitor how you're doing and return to the ER for new or worsening symptoms.

## 2022-08-09 NOTE — ED Triage Notes (Signed)
Pt arrived POV, caox4, ambulatory, NAD stating she "needs rabies shot" because she was bit by a stray dog 9 days ago. Bruising with puncture marks to R anterior thigh. Pt denies pain at present.

## 2022-08-12 ENCOUNTER — Ambulatory Visit (HOSPITAL_COMMUNITY)
Admission: RE | Admit: 2022-08-12 | Discharge: 2022-08-12 | Disposition: A | Discharge status: Home / Self Care | Payer: BC Managed Care – PPO | Source: Ambulatory Visit | Attending: Emergency Medicine | Admitting: Emergency Medicine

## 2022-08-12 ENCOUNTER — Encounter (HOSPITAL_COMMUNITY): Payer: Self-pay

## 2022-08-12 DIAGNOSIS — Z203 Contact with and (suspected) exposure to rabies: Secondary | ICD-10-CM

## 2022-08-12 MED ORDER — RABIES VACCINE, PCEC IM SUSR
INTRAMUSCULAR | Status: AC
  Filled 2022-08-12: qty 1

## 2022-08-12 MED ORDER — RABIES VACCINE, PCEC IM SUSR
1.0000 mL | Freq: Once | INTRAMUSCULAR | Status: AC
  Administered 2022-08-12: 1 mL via INTRAMUSCULAR

## 2022-08-12 NOTE — ED Triage Notes (Signed)
Pt is here for rabies injection. Pt stated this is her 2nd or 3rd injection.

## 2022-08-16 ENCOUNTER — Ambulatory Visit (HOSPITAL_COMMUNITY)
Admission: EM | Admit: 2022-08-16 | Discharge: 2022-08-16 | Disposition: A | Discharge status: Home / Self Care | Payer: BC Managed Care – PPO | Attending: Internal Medicine | Admitting: Internal Medicine

## 2022-08-16 DIAGNOSIS — Z203 Contact with and (suspected) exposure to rabies: Secondary | ICD-10-CM

## 2022-08-16 MED ORDER — RABIES VACCINE, PCEC IM SUSR
1.0000 mL | Freq: Once | INTRAMUSCULAR | Status: AC
  Administered 2022-08-16: 1 mL via INTRAMUSCULAR

## 2022-08-16 MED ORDER — RABIES VACCINE, PCEC IM SUSR
INTRAMUSCULAR | Status: AC
  Filled 2022-08-16: qty 1

## 2022-08-16 NOTE — ED Notes (Signed)
Called pharmacy for med, pt advised that we will give vaccine when it arrives.

## 2022-08-16 NOTE — ED Triage Notes (Signed)
Pt here today for rabies vaccine.

## 2022-08-23 ENCOUNTER — Ambulatory Visit (HOSPITAL_COMMUNITY)
Admission: RE | Admit: 2022-08-23 | Discharge: 2022-08-23 | Disposition: A | Discharge status: Home / Self Care | Payer: BC Managed Care – PPO | Source: Ambulatory Visit | Attending: Emergency Medicine | Admitting: Emergency Medicine

## 2022-08-23 DIAGNOSIS — Z203 Contact with and (suspected) exposure to rabies: Secondary | ICD-10-CM

## 2022-08-23 MED ORDER — RABIES VACCINE, PCEC IM SUSR
INTRAMUSCULAR | Status: AC
  Filled 2022-08-23: qty 1

## 2022-08-23 MED ORDER — RABIES VACCINE, PCEC IM SUSR
1.0000 mL | Freq: Once | INTRAMUSCULAR | Status: AC
  Administered 2022-08-23: 1 mL via INTRAMUSCULAR

## 2022-08-23 NOTE — ED Triage Notes (Signed)
Pt here today for 4th rabies vaccine

## 2023-05-01 ENCOUNTER — Ambulatory Visit (INDEPENDENT_AMBULATORY_CARE_PROVIDER_SITE_OTHER): Payer: Self-pay | Admitting: Nurse Practitioner

## 2023-05-01 ENCOUNTER — Encounter: Payer: Self-pay | Admitting: Nurse Practitioner

## 2023-05-01 VITALS — BP 126/84 | HR 84 | Ht 63.25 in | Wt 123.0 lb

## 2023-05-01 DIAGNOSIS — Z1331 Encounter for screening for depression: Secondary | ICD-10-CM

## 2023-05-01 DIAGNOSIS — Z01419 Encounter for gynecological examination (general) (routine) without abnormal findings: Secondary | ICD-10-CM

## 2023-05-01 NOTE — Progress Notes (Signed)
   Tanya Valencia 09-04-1990 [id number]   History:  33 y.o. G0 presents for annual exam. Monthly cycles. Stopped OCPs last year. Not interested in contraception at this time. Tracking cycles. Normal pap history. History of anxiety, depression, Graves disease.   Gynecologic History Patient's last menstrual period was 04/12/2023 (exact date). Period Cycle (Days):  (27-28) Period Duration (Days): 3 Menstrual Flow:  (light to moderate) Menstrual Control: Maxi pad, Tampon Dysmenorrhea: (!) Moderate Dysmenorrhea Symptoms: Cramping Contraception/Family planning: rhythm method Sexually active: Yes  Health Maintenance Last Pap: 03/13/2022. Results were: Normal Last mammogram: Not indicated Last colonoscopy: Not indicated Last Dexa: Not indicated  Flowsheet Row Office Visit from 05/01/2023 in [facility]  PHQ-2 Total Score 0        Past medical history, past surgical history, family history and social history were all reviewed and documented in the EPIC chart. Boyfriend. 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 Father with ALS.   ROS:  A ROS was performed and pertinent positives and negatives are included.  Exam:  Vitals:   05/01/23 1526  BP: 126/84  Pulse: 84  SpO2: 99%  Weight: 123 lb (55.8 kg)  Height: 5' 3.25" (1.607 m)     Body mass index is 21.62 kg/m.  General appearance:  Normal Thyroid:  Symmetrical, normal in size, without palpable masses or nodularity. Respiratory  Auscultation:  Clear without wheezing or rhonchi Cardiovascular  Auscultation:  Regular rate, without rubs, murmurs or gallops  Edema/varicosities:  Not grossly evident Abdominal  Soft,nontender, without masses, guarding or rebound.  Liver/spleen:  No organomegaly noted  Hernia:  None appreciated  Skin  Inspection:  Grossly normal Breasts: Examined lying and sitting.   Right: Without masses, retractions, nipple discharge or axillary adenopathy.   Left: Without masses,  retractions, nipple discharge or axillary adenopathy. Pelvic: External genitalia:  no lesions              Urethra:  normal appearing urethra with no masses, tenderness or lesions              Bartholins and Skenes: normal                 Vagina: normal appearing vagina with normal color and discharge, no lesions              Cervix: no lesions Bimanual Exam:  Uterus:  no masses or tenderness              Adnexa: no mass, fullness, tenderness              Rectovaginal: Deferred              Anus:  normal, no lesions  Patient informed chaperone available to be present for breast and pelvic exam. Patient has requested no chaperone to be present. Patient has been advised what will be completed during breast and pelvic exam.   Assessment/Plan:  33 y.o. G0 for annual exam.   Well female exam with routine gynecological exam - Education provided on SBEs, importance of preventative screenings, current guidelines, high calcium diet, regular exercise, and multivitamin daily. Labs with PCP.   Screening for cervical cancer - Normal Pap history. Will repeat at 3-year interval per guidelines.   Return in about 1 year (around 04/30/2024) for Annual.    [name] DNP, 3:56 PM 05/01/2023

## 2024-05-04 ENCOUNTER — Ambulatory Visit: Admitting: Nurse Practitioner

## 2024-05-05 ENCOUNTER — Ambulatory Visit: Admitting: Nurse Practitioner
